# Patient Record
Sex: Female | Born: 1985 | Race: White | Hispanic: No | Smoking: Former smoker
Health system: Southern US, Community
[De-identification: ages and names within clinical notes are randomized; demographics above are authoritative.]

## PROBLEM LIST (undated history)

## (undated) DIAGNOSIS — D649 Anemia, unspecified: Secondary | ICD-10-CM

## (undated) DIAGNOSIS — G43909 Migraine, unspecified, not intractable, without status migrainosus: Secondary | ICD-10-CM

## (undated) DIAGNOSIS — J189 Pneumonia, unspecified organism: Secondary | ICD-10-CM

## (undated) DIAGNOSIS — Z8489 Family history of other specified conditions: Secondary | ICD-10-CM

## (undated) HISTORY — PX: DENTAL SURGERY: SHX609

## (undated) HISTORY — PX: TUBAL LIGATION: SHX77

## (undated) HISTORY — DX: Migraine, unspecified, not intractable, without status migrainosus: G43.909

## (undated) HISTORY — PX: OTHER SURGICAL HISTORY: SHX169

---

## 2004-12-25 ENCOUNTER — Ambulatory Visit: Payer: Self-pay | Admitting: Unknown Physician Specialty

## 2005-05-15 ENCOUNTER — Observation Stay: Payer: Self-pay | Admitting: Obstetrics and Gynecology

## 2005-06-06 ENCOUNTER — Emergency Department: Payer: Self-pay | Admitting: Emergency Medicine

## 2005-07-13 ENCOUNTER — Observation Stay: Payer: Self-pay

## 2005-07-14 ENCOUNTER — Observation Stay: Payer: Self-pay

## 2005-07-15 ENCOUNTER — Observation Stay: Payer: Self-pay

## 2005-07-16 ENCOUNTER — Ambulatory Visit: Payer: Self-pay | Admitting: Obstetrics and Gynecology

## 2005-07-26 ENCOUNTER — Observation Stay: Payer: Self-pay

## 2005-08-01 ENCOUNTER — Observation Stay: Payer: Self-pay | Admitting: Obstetrics and Gynecology

## 2005-08-02 ENCOUNTER — Observation Stay: Payer: Self-pay

## 2005-08-09 ENCOUNTER — Observation Stay: Payer: Self-pay | Admitting: Obstetrics and Gynecology

## 2005-08-13 ENCOUNTER — Observation Stay: Payer: Self-pay

## 2005-08-15 ENCOUNTER — Observation Stay: Payer: Self-pay

## 2005-08-26 ENCOUNTER — Inpatient Hospital Stay: Payer: Self-pay

## 2007-06-27 ENCOUNTER — Observation Stay: Payer: Self-pay | Admitting: Certified Nurse Midwife

## 2007-06-30 ENCOUNTER — Observation Stay: Payer: Self-pay | Admitting: Obstetrics and Gynecology

## 2007-07-13 ENCOUNTER — Inpatient Hospital Stay: Payer: Self-pay | Admitting: Obstetrics and Gynecology

## 2008-01-14 ENCOUNTER — Ambulatory Visit: Payer: Self-pay | Admitting: Internal Medicine

## 2011-01-30 ENCOUNTER — Observation Stay: Payer: Self-pay

## 2011-03-03 ENCOUNTER — Emergency Department: Payer: Self-pay | Admitting: Emergency Medicine

## 2011-03-10 ENCOUNTER — Observation Stay: Payer: Self-pay | Admitting: Obstetrics & Gynecology

## 2011-04-10 ENCOUNTER — Observation Stay: Payer: Self-pay

## 2011-05-01 ENCOUNTER — Inpatient Hospital Stay: Payer: Self-pay

## 2011-05-09 ENCOUNTER — Observation Stay: Payer: Self-pay | Admitting: Obstetrics and Gynecology

## 2011-06-03 ENCOUNTER — Observation Stay: Payer: Self-pay

## 2011-06-04 ENCOUNTER — Observation Stay: Payer: Self-pay

## 2011-06-16 ENCOUNTER — Inpatient Hospital Stay: Payer: Self-pay | Admitting: Obstetrics and Gynecology

## 2011-12-31 HISTORY — PX: CHOLECYSTECTOMY: SHX55

## 2012-01-12 ENCOUNTER — Emergency Department (HOSPITAL_COMMUNITY)
Admission: EM | Admit: 2012-01-12 | Discharge: 2012-01-12 | Disposition: A | Discharge status: Home / Self Care | Payer: BC Managed Care – PPO | Attending: Emergency Medicine | Admitting: Emergency Medicine

## 2012-01-12 ENCOUNTER — Encounter (HOSPITAL_COMMUNITY): Payer: Self-pay | Admitting: *Deleted

## 2012-01-12 DIAGNOSIS — R209 Unspecified disturbances of skin sensation: Secondary | ICD-10-CM | POA: Insufficient documentation

## 2012-01-12 DIAGNOSIS — R112 Nausea with vomiting, unspecified: Secondary | ICD-10-CM | POA: Insufficient documentation

## 2012-01-12 DIAGNOSIS — R51 Headache: Secondary | ICD-10-CM | POA: Insufficient documentation

## 2012-01-12 MED ORDER — KETOROLAC TROMETHAMINE 30 MG/ML IJ SOLN: 30.0000 mg | Freq: Once | INTRAMUSCULAR | Status: DC

## 2012-01-12 MED ORDER — METOCLOPRAMIDE HCL 5 MG/ML IJ SOLN: 10.0000 mg | Freq: Once | INTRAMUSCULAR | Status: DC

## 2012-01-12 MED ORDER — DEXAMETHASONE SODIUM PHOSPHATE 10 MG/ML IJ SOLN: 10.0000 mg | Freq: Once | INTRAMUSCULAR | Status: DC

## 2012-01-12 MED ORDER — LORAZEPAM 1 MG PO TABS
1.0000 mg | ORAL_TABLET | Freq: Once | ORAL | Status: AC
  Administered 2012-01-12: 1 mg via ORAL
  Filled 2012-01-12: qty 1

## 2012-01-12 MED ORDER — BUTORPHANOL TARTRATE 1 MG/ML IJ SOLN
1.0000 mg | Freq: Once | INTRAMUSCULAR | Status: AC
  Administered 2012-01-12: 1 mg via INTRAMUSCULAR
  Filled 2012-01-12: qty 1

## 2012-01-12 MED ORDER — PROMETHAZINE HCL 25 MG/ML IJ SOLN
12.5000 mg | Freq: Once | INTRAMUSCULAR | Status: AC
  Administered 2012-01-12: 12.5 mg via INTRAVENOUS
  Filled 2012-01-12: qty 1

## 2012-01-12 MED ORDER — DIPHENHYDRAMINE HCL 50 MG/ML IJ SOLN: 25.0000 mg | Freq: Once | INTRAMUSCULAR | Status: DC

## 2012-01-12 NOTE — ED Notes (Signed)
Pt refused IV placement, due to meds that MD ordered, pt states that she needs a certain dose and will not take what the MD ordered. MD made aware and orders changed to PO and IM

## 2012-01-12 NOTE — ED Notes (Signed)
Patient is experiencing migraine last night and felt fine this morning but then it got worse.  States that her face feels numb and she can't seem to relax and she does not feel like herself, almost like she is coming out of her skin.  Patient is crying at triage

## 2012-01-12 NOTE — ED Provider Notes (Signed)
History     CSN: [id number]  Arrival date & time 01/12/12  8657   First MD Initiated Contact with Patient 01/12/12 2008      Chief Complaint  Patient presents with  . Migraine     HPI  History provided by the patient and mother. Patient's 26 year old female with history of complex migraines formerly followed by Dr. Sharene Skeans and [facility] with a [facility] who presents with typical migraine headache unrelieved home with medications. Patient reports having headache on the left skin top of her head similar to prior headaches. She also reports having numbness and tingling to the right side of her face and right upper extremity. She reports this is typical of her headaches in the past. Patient was recently taken off her medications for headache during a pregnancy. She has just returned to using Topamax over the past 3-4 days to help prevent her headaches. She also was given the medication that she cannot recall the name for breakthrough pain of headaches. She has been taking both of these as instructed without improvement at home today. She states that on Friday night when she began having a headache she was able to fall asleep and had improvement of headache when she woke the next morning. She denies any other symptoms. She denies any fever, chills, sweats, weakness, difficulty speaking, confusion.   Past Medical History  Diagnosis Date  . Migraine     History reviewed. No pertinent past surgical history.  History reviewed. No pertinent family history.  History  Substance Use Topics  . Smoking status: Never Smoker   . Smokeless tobacco: Not on file  . Alcohol Use: Yes    OB History    Grav Para Term Preterm Abortions TAB SAB Ect Mult Living                  Review of Systems  Constitutional: Negative for fever and chills.  Respiratory: Negative for cough and shortness of breath.   Cardiovascular: Negative for chest pain.  Gastrointestinal: Positive for  nausea and vomiting. Negative for abdominal pain, diarrhea and constipation.  Neurological: Positive for headaches. Negative for dizziness and light-headedness.  All other systems reviewed and are negative.    Allergies  Latex  Home Medications   Current Outpatient Rx  Name Route Sig Dispense Refill  . TOPIRAMATE 50 MG PO TABS Oral Take 50 mg by mouth at bedtime.      BP 141/78  Pulse 117  Temp(Src) 97.6 F (36.4 C) (Oral)  Resp 20  SpO2 98%  Physical Exam  Nursing note and vitals reviewed. Constitutional: She is oriented to person, place, and time. She appears well-developed and well-nourished. No distress.  HENT:  Head: Normocephalic and atraumatic.  Mouth/Throat: Oropharynx is clear and moist.  Eyes: Conjunctivae and EOM are normal. Pupils are equal, round, and reactive to light.       No nystagmus   Neck: Normal range of motion. Neck supple. No thyromegaly present.  Cardiovascular: Normal rate and regular rhythm.   Pulmonary/Chest: Effort normal and breath sounds normal. No respiratory distress. She has no wheezes. She has no rales.  Abdominal: Soft. There is no tenderness. There is no rebound.  Musculoskeletal: She exhibits no edema and no tenderness.  Neurological: She is alert and oriented to person, place, and time. She has normal strength. No cranial nerve deficit or sensory deficit. Coordination and gait normal.  Skin: Skin is warm and dry. No rash noted.  Psychiatric: She has  a normal mood and affect. Her behavior is normal.    ED Course  Procedures    1. Headache       MDM  8:30 PM patient seen and evaluated. Patient in no acute distress. Patient with typical migraine symptoms. No red flags.  8:45 PM patient has refused standard migraine cocktail of Reglan, Decadron, Benadryl and Toradol. She has also refused an IV. Patient states the only things at work are Demerol, Stadol, and Phenergan. Have explained to patient that we do not typically give  Demerol here in the emergency room. Will plan to give Stadol and Phenergan.   Medical screening examination/treatment/procedure(s) were performed by non-physician practitioner and as supervising physician I was immediately available for consultation/collaboration. Jerold Coombe, M.D.      Angus Seller, PA 01/13/12 0449  Carleene Cooper III, MD 01/14/12 [phone]

## 2012-03-01 ENCOUNTER — Emergency Department: Payer: Self-pay | Admitting: Emergency Medicine

## 2012-04-23 ENCOUNTER — Emergency Department: Payer: Self-pay | Admitting: Emergency Medicine

## 2012-05-08 ENCOUNTER — Ambulatory Visit: Payer: Self-pay | Admitting: Internal Medicine

## 2012-09-10 ENCOUNTER — Ambulatory Visit: Payer: Self-pay | Admitting: Internal Medicine

## 2012-09-14 ENCOUNTER — Inpatient Hospital Stay: Payer: Self-pay | Admitting: Surgery

## 2012-09-14 ENCOUNTER — Ambulatory Visit: Payer: Self-pay | Admitting: Internal Medicine

## 2012-09-14 LAB — COMPREHENSIVE METABOLIC PANEL
Alkaline Phosphatase: 71 U/L (ref 50–136)
Anion Gap: 10 (ref 7–16)
BUN: 9 mg/dL (ref 7–18)
Bilirubin,Total: 0.2 mg/dL (ref 0.2–1.0)
Chloride: 109 mmol/L — ABNORMAL HIGH (ref 98–107)
Creatinine: 0.71 mg/dL (ref 0.60–1.30)
EGFR (African American): >60
Potassium: 3.1 mmol/L — ABNORMAL LOW (ref 3.5–5.1)
Total Protein: 7.5 g/dL (ref 6.4–8.2)

## 2012-09-14 LAB — CBC
HCT: 36.1 % (ref 35.0–47.0)
HGB: 12.2 g/dL (ref 12.0–16.0)
MCH: 30.2 pg (ref 26.0–34.0)
MCV: 89 fL (ref 80–100)
Platelet: 416 10*3/uL (ref 150–440)
RBC: 4.04 10*6/uL (ref 3.80–5.20)
WBC: 10.3 10*3/uL (ref 3.6–11.0)

## 2012-09-14 LAB — URINALYSIS, COMPLETE
Bilirubin,UR: NEGATIVE
Blood: NEGATIVE
Glucose,UR: NEGATIVE mg/dL (ref 0–75)
Ketone: NEGATIVE
Ph: 6 (ref 4.5–8.0)
RBC,UR: 3 /HPF (ref 0–5)
Specific Gravity: 1.024 (ref 1.003–1.030)
WBC UR: NONE SEEN /HPF (ref 0–5)

## 2012-09-15 LAB — COMPREHENSIVE METABOLIC PANEL
Albumin: 2.9 g/dL — ABNORMAL LOW (ref 3.4–5.0)
Anion Gap: 10 (ref 7–16)
BUN: 4 mg/dL — ABNORMAL LOW (ref 7–18)
Bilirubin,Total: 0.2 mg/dL (ref 0.2–1.0)
Calcium, Total: 7.7 mg/dL — ABNORMAL LOW (ref 8.5–10.1)
Chloride: 111 mmol/L — ABNORMAL HIGH (ref 98–107)
EGFR (African American): >60
EGFR (Non-African Amer.): >60
Glucose: 89 mg/dL (ref 65–99)
Potassium: 3.3 mmol/L — ABNORMAL LOW (ref 3.5–5.1)
SGOT(AST): 11 U/L — ABNORMAL LOW (ref 15–37)
Sodium: 143 mmol/L (ref 136–145)

## 2012-09-15 LAB — CBC WITH DIFFERENTIAL/PLATELET
Basophil %: 0.7 %
Eosinophil #: 0.5 10*3/uL (ref 0.0–0.7)
HCT: 32.4 % — ABNORMAL LOW (ref 35.0–47.0)
HGB: 10.8 g/dL — ABNORMAL LOW (ref 12.0–16.0)
MCH: 30.3 pg (ref 26.0–34.0)
MCHC: 33.4 g/dL (ref 32.0–36.0)
MCV: 91 fL (ref 80–100)
Monocyte #: 0.6 x10 3/mm (ref 0.2–0.9)
Monocyte %: 7.2 %
Neutrophil %: 41.4 %
Platelet: 329 10*3/uL (ref 150–440)
RBC: 3.57 10*6/uL — ABNORMAL LOW (ref 3.80–5.20)

## 2013-01-19 ENCOUNTER — Emergency Department: Payer: Self-pay | Admitting: Emergency Medicine

## 2013-01-19 LAB — POTASSIUM: Potassium: 3.2 mmol/L — ABNORMAL LOW (ref 3.5–5.1)

## 2013-01-19 LAB — CBC WITH DIFFERENTIAL/PLATELET
Basophil %: 0.2 %
Eosinophil %: 0.1 %
HGB: 12.1 g/dL (ref 12.0–16.0)
Lymphocyte %: 2.5 %
MCV: 91 fL (ref 80–100)
Monocyte #: 0.4 x10 3/mm (ref 0.2–0.9)
Monocyte %: 3.3 %
RBC: 3.99 10*6/uL (ref 3.80–5.20)
WBC: 12.6 10*3/uL — ABNORMAL HIGH (ref 3.6–11.0)

## 2013-01-19 LAB — COMPREHENSIVE METABOLIC PANEL
Albumin: 3.9 g/dL (ref 3.4–5.0)
Alkaline Phosphatase: 121 U/L (ref 50–136)
Anion Gap: 11 (ref 7–16)
BUN: 13 mg/dL (ref 7–18)
Bilirubin,Total: 0.4 mg/dL (ref 0.2–1.0)
Co2: 16 mmol/L — ABNORMAL LOW (ref 21–32)
EGFR (African American): >60
Glucose: 106 mg/dL — ABNORMAL HIGH (ref 65–99)
Potassium: 3.7 mmol/L (ref 3.5–5.1)
SGOT(AST): 27 U/L (ref 15–37)
Sodium: 141 mmol/L (ref 136–145)

## 2013-01-20 ENCOUNTER — Inpatient Hospital Stay: Payer: Self-pay | Admitting: Internal Medicine

## 2013-01-20 LAB — COMPREHENSIVE METABOLIC PANEL
Anion Gap: 8 (ref 7–16)
BUN: 6 mg/dL — ABNORMAL LOW (ref 7–18)
Bilirubin,Total: 0.3 mg/dL (ref 0.2–1.0)
Chloride: 113 mmol/L — ABNORMAL HIGH (ref 98–107)
Co2: 22 mmol/L (ref 21–32)
Creatinine: 0.66 mg/dL (ref 0.60–1.30)
EGFR (Non-African Amer.): >60
Osmolality: 282 (ref 275–301)
Potassium: 3.4 mmol/L — ABNORMAL LOW (ref 3.5–5.1)
SGOT(AST): 55 U/L — ABNORMAL HIGH (ref 15–37)

## 2013-01-20 LAB — URINALYSIS, COMPLETE
Bacteria: NONE SEEN
Glucose,UR: NEGATIVE mg/dL (ref 0–75)
Leukocyte Esterase: NEGATIVE
Nitrite: NEGATIVE
Protein: NEGATIVE
RBC,UR: 4 /HPF (ref 0–5)

## 2013-01-20 LAB — LIPASE, BLOOD: Lipase: 124 U/L (ref 73–393)

## 2013-01-20 LAB — CBC
HCT: 35.5 % (ref 35.0–47.0)
MCH: 30.3 pg (ref 26.0–34.0)
MCHC: 33.5 g/dL (ref 32.0–36.0)
MCV: 91 fL (ref 80–100)
Platelet: 268 10*3/uL (ref 150–440)
RBC: 3.92 10*6/uL (ref 3.80–5.20)
WBC: 5.1 10*3/uL (ref 3.6–11.0)

## 2013-01-20 LAB — HCG, QUANTITATIVE, PREGNANCY: Beta Hcg, Quant.: <1 m[IU]/mL — ABNORMAL LOW

## 2013-01-20 LAB — MAGNESIUM: Magnesium: 1.9 mg/dL

## 2013-01-21 LAB — COMPREHENSIVE METABOLIC PANEL
Albumin: 2.8 g/dL — ABNORMAL LOW (ref 3.4–5.0)
Anion Gap: 10 (ref 7–16)
BUN: 3 mg/dL — ABNORMAL LOW (ref 7–18)
Calcium, Total: 7 mg/dL — CL (ref 8.5–10.1)
Chloride: 113 mmol/L — ABNORMAL HIGH (ref 98–107)
Osmolality: 281 (ref 275–301)
Potassium: 3.2 mmol/L — ABNORMAL LOW (ref 3.5–5.1)
SGOT(AST): 202 U/L — ABNORMAL HIGH (ref 15–37)
SGPT (ALT): 244 U/L — ABNORMAL HIGH (ref 12–78)
Sodium: 143 mmol/L (ref 136–145)
Total Protein: 5.5 g/dL — ABNORMAL LOW (ref 6.4–8.2)

## 2013-01-21 LAB — CBC WITH DIFFERENTIAL/PLATELET
Basophil #: 0 10*3/uL (ref 0.0–0.1)
Basophil %: 0.6 %
Eosinophil #: 0.3 10*3/uL (ref 0.0–0.7)
Eosinophil %: 4.9 %
HCT: 30.4 % — ABNORMAL LOW (ref 35.0–47.0)
Lymphocyte #: 2.4 10*3/uL (ref 1.0–3.6)
Lymphocyte %: 40.9 %
MCH: 31.5 pg (ref 26.0–34.0)
Monocyte #: 0.6 x10 3/mm (ref 0.2–0.9)
Neutrophil %: 42.5 %

## 2013-01-21 LAB — PREGNANCY, URINE: Pregnancy Test, Urine: NEGATIVE m[IU]/mL

## 2013-01-22 LAB — MAGNESIUM: Magnesium: 2.1 mg/dL

## 2013-01-22 LAB — BASIC METABOLIC PANEL
Anion Gap: 8 (ref 7–16)
Calcium, Total: 7.4 mg/dL — ABNORMAL LOW (ref 8.5–10.1)
Creatinine: 0.66 mg/dL (ref 0.60–1.30)
EGFR (Non-African Amer.): >60
Glucose: 91 mg/dL (ref 65–99)
Potassium: 3.7 mmol/L (ref 3.5–5.1)
Sodium: 143 mmol/L (ref 136–145)

## 2013-02-02 ENCOUNTER — Emergency Department: Payer: Self-pay | Admitting: Emergency Medicine

## 2013-03-27 ENCOUNTER — Emergency Department: Payer: Self-pay | Admitting: Emergency Medicine

## 2013-04-04 ENCOUNTER — Emergency Department: Payer: Self-pay | Admitting: Emergency Medicine

## 2013-04-04 LAB — BASIC METABOLIC PANEL
Anion Gap: 6 — ABNORMAL LOW (ref 7–16)
BUN: 13 mg/dL (ref 7–18)
Calcium, Total: 7.7 mg/dL — ABNORMAL LOW (ref 8.5–10.1)
Chloride: 112 mmol/L — ABNORMAL HIGH (ref 98–107)
Creatinine: 0.68 mg/dL (ref 0.60–1.30)
EGFR (Non-African Amer.): >60
Osmolality: 282 (ref 275–301)

## 2013-04-04 LAB — TROPONIN I: Troponin-I: <0.02 ng/mL

## 2013-04-04 LAB — CBC
HCT: 42.4 % (ref 35.0–47.0)
MCHC: 32.3 g/dL (ref 32.0–36.0)
RBC: 4.59 10*6/uL (ref 3.80–5.20)
RDW: 13.6 % (ref 11.5–14.5)
WBC: 18 10*3/uL — ABNORMAL HIGH (ref 3.6–11.0)

## 2013-04-04 LAB — CK TOTAL AND CKMB (NOT AT ARMC): CK-MB: <0.5 ng/mL — ABNORMAL LOW (ref 0.5–3.6)

## 2013-04-21 ENCOUNTER — Ambulatory Visit (INDEPENDENT_AMBULATORY_CARE_PROVIDER_SITE_OTHER): Payer: BC Managed Care – PPO | Admitting: General Surgery

## 2013-04-21 ENCOUNTER — Other Ambulatory Visit: Payer: Self-pay

## 2013-04-21 ENCOUNTER — Encounter: Payer: Self-pay | Admitting: General Surgery

## 2013-04-21 VITALS — BP 102/72 | HR 86 | Resp 12 | Ht 64.0 in | Wt 138.0 lb

## 2013-04-21 DIAGNOSIS — R221 Localized swelling, mass and lump, neck: Secondary | ICD-10-CM

## 2013-04-21 DIAGNOSIS — L723 Sebaceous cyst: Secondary | ICD-10-CM

## 2013-04-21 DIAGNOSIS — L729 Follicular cyst of the skin and subcutaneous tissue, unspecified: Secondary | ICD-10-CM

## 2013-04-21 DIAGNOSIS — R220 Localized swelling, mass and lump, head: Secondary | ICD-10-CM

## 2013-04-21 NOTE — Patient Instructions (Addendum)
Discussed removal of the neck nodule in the office

## 2013-04-21 NOTE — Progress Notes (Signed)
Patient ID: [name], female   DOB: [birth year], 27 y.o.   MRN: [id number]  Chief Complaint  Patient presents with  . Other    neck cyst    HPI [name] is a 27 y.o. female here today for an evaluation of an posterior neck cyst at hair line that has been there for about 2 years but seems to be getting larger over past 2 months.  Seems to be causing more neck pain.  Went to dermatology but no biopsy done. She does have a history of migraines and gets Botox injections and trigger point injections. The patient reports that she has discussed this lesion with her neurologist who completes the Botox injections as well as trigger point injections was advised that not have been completed if this area of her concern.  HPI  Past Medical History  Diagnosis Date  . Migraine     hemiplegic  . Migraine     Past Surgical History  Procedure Laterality Date  . Cholecystectomy  2013  . Dental surgery      No family history on file.  Social History History  Substance Use Topics  . Smoking status: Former Games developer  . Smokeless tobacco: Not on file  . Alcohol Use: Yes     Comment: 4/week    Allergies  Allergen Reactions  . Latex Rash    Current Outpatient Prescriptions  Medication Sig Dispense Refill  . gabapentin (NEURONTIN) 300 MG capsule Take 300 mg by mouth 3 (three) times daily.       Marland Kitchen ketorolac (TORADOL) 30 MG/ML injection Inject 60 mg into the muscle every 6 (six) hours as needed for pain.      Marland Kitchen OLANZapine (ZYPREXA) 2.5 MG tablet Take 2.5 mg by mouth as needed.      . pantoprazole (PROTONIX) 40 MG tablet Take 40 mg by mouth daily.      . promethazine (PHENERGAN) 50 MG/ML injection Inject into the muscle.      . topiramate (TOPAMAX) 50 MG tablet Take 50 mg by mouth at bedtime.       No current facility-administered medications for this visit.    Review of Systems Review of Systems  Constitutional: Negative.   Respiratory: Negative.   Cardiovascular: Negative.     Blood  pressure 102/72, pulse 86, resp. rate 12, height 5\' 4"  (1.626 m), weight 138 lb (62.596 kg), last menstrual period 03/24/2013.  Physical Exam Physical Exam  Constitutional: She is oriented to person, place, and time. She appears well-developed and well-nourished.  Cardiovascular: Normal rate and regular rhythm.   Pulmonary/Chest: Effort normal and breath sounds normal.  Neurological: She is alert and oriented to person, place, and time.  Skin: Skin is warm and dry.  Right posterior neck 6 mm nodule   No anterior or posterior cervical adenopathy is appreciated.  Data Reviewed Notes from the patient's dermatologist dated April 01, 2013 were reviewed.  Ultrasound examination of the area of concern on the right posterior neck identified a anechoic/hypoechoic nodule with posterior acoustic enhancement measuring 0.39 x 0.63 x 0.68 cm. This was approximately 0.45 cm below the level of the skin, above the level of the muscle. No vascular flow was identified on duplex imaging.  Assessment    Deep dermal cyst, right posterior neck.     Plan      Because of the patient's report of increasing size and discomfort excision was offered as an office procedure under local anesthesia. This be scheduled a convenient time. Because  of its location on the back, she has been encouraged to have a driver the day of the procedure.       [name] 04/21/2013, 9:32 PM  Cc: Diona Browner, M.D.

## 2013-05-18 ENCOUNTER — Telehealth: Payer: Self-pay | Admitting: *Deleted

## 2013-05-18 NOTE — Telephone Encounter (Signed)
Will RX w/ Valium 10 mg po prior to the procedure. The patient will come in to pick up the RX and sign consent. She is aware of the need for a driver.

## 2013-05-18 NOTE — Telephone Encounter (Signed)
Phone call from mother states Armetta wanted her to call and ask if she could have a RX form something to help calm her down the day of the procedure-excision right neck mass on 05-27-13.  She is real nervous and passes out easily. She is aware that a consent would need to be signed ahead of time. Uses [facility] and is allergic to LATEX.  Pt  [id number].  Thanks

## 2013-05-20 ENCOUNTER — Inpatient Hospital Stay: Payer: Self-pay | Admitting: Family Medicine

## 2013-05-20 LAB — CBC
MCH: 30.2 pg (ref 26.0–34.0)
Platelet: 249 10*3/uL (ref 150–440)

## 2013-05-20 LAB — COMPREHENSIVE METABOLIC PANEL
Albumin: 3.7 g/dL (ref 3.4–5.0)
Alkaline Phosphatase: 59 U/L (ref 50–136)
BUN: 8 mg/dL (ref 7–18)
Calcium, Total: 8.1 mg/dL — ABNORMAL LOW (ref 8.5–10.1)
Co2: 20 mmol/L — ABNORMAL LOW (ref 21–32)
Creatinine: 0.59 mg/dL — ABNORMAL LOW (ref 0.60–1.30)
EGFR (African American): >60
Osmolality: 280 (ref 275–301)
SGOT(AST): 29 U/L (ref 15–37)
Sodium: 141 mmol/L (ref 136–145)

## 2013-05-20 LAB — URINALYSIS, COMPLETE
Bilirubin,UR: NEGATIVE
Ketone: NEGATIVE
Leukocyte Esterase: NEGATIVE
Nitrite: NEGATIVE
Ph: 5 (ref 4.5–8.0)
Protein: NEGATIVE

## 2013-05-20 LAB — CK: CK, Total: 53 U/L (ref 21–215)

## 2013-05-21 LAB — CBC WITH DIFFERENTIAL/PLATELET
Basophil #: 0 10*3/uL (ref 0.0–0.1)
Basophil %: 0.6 %
Eosinophil #: 0 10*3/uL (ref 0.0–0.7)
HGB: 10.1 g/dL — ABNORMAL LOW (ref 12.0–16.0)
Lymphocyte #: 0.9 10*3/uL — ABNORMAL LOW (ref 1.0–3.6)
Lymphocyte %: 31.4 %
MCHC: 33.9 g/dL (ref 32.0–36.0)
Monocyte #: 0.3 x10 3/mm (ref 0.2–0.9)
Monocyte %: 10.9 %
Platelet: 205 10*3/uL (ref 150–440)

## 2013-05-21 LAB — BASIC METABOLIC PANEL
Anion Gap: 6 — ABNORMAL LOW (ref 7–16)
Calcium, Total: 6.9 mg/dL — CL (ref 8.5–10.1)
Chloride: 112 mmol/L — ABNORMAL HIGH (ref 98–107)
Co2: 22 mmol/L (ref 21–32)
Creatinine: 0.66 mg/dL (ref 0.60–1.30)
EGFR (Non-African Amer.): >60
Potassium: 3 mmol/L — ABNORMAL LOW (ref 3.5–5.1)
Sodium: 140 mmol/L (ref 136–145)

## 2013-05-21 LAB — SEDIMENTATION RATE: Erythrocyte Sed Rate: 13 mm/hr (ref 0–20)

## 2013-05-22 LAB — CBC WITH DIFFERENTIAL/PLATELET
Basophil #: 0 10*3/uL (ref 0.0–0.1)
Basophil #: 0 10*3/uL (ref 0.0–0.1)
Basophil %: 0.3 %
Eosinophil #: 0.1 10*3/uL (ref 0.0–0.7)
Eosinophil %: 1.3 %
HCT: 32.6 % — ABNORMAL LOW (ref 35.0–47.0)
Lymphocyte #: 0.4 10*3/uL — ABNORMAL LOW (ref 1.0–3.6)
MCHC: 33.6 g/dL (ref 32.0–36.0)
MCHC: 34.1 g/dL (ref 32.0–36.0)
MCV: 89 fL (ref 80–100)
MCV: 88 fL (ref 80–100)
Monocyte #: 0.3 x10 3/mm (ref 0.2–0.9)
Monocyte #: 0.3 x10 3/mm (ref 0.2–0.9)
Monocyte %: 4.2 %
Platelet: 229 10*3/uL (ref 150–440)
Platelet: 235 10*3/uL (ref 150–440)
RBC: 3.63 10*6/uL — ABNORMAL LOW (ref 3.80–5.20)
RBC: 3.68 10*6/uL — ABNORMAL LOW (ref 3.80–5.20)
WBC: 8.6 10*3/uL (ref 3.6–11.0)

## 2013-05-22 LAB — BASIC METABOLIC PANEL
Anion Gap: 6 — ABNORMAL LOW (ref 7–16)
Calcium, Total: 7.9 mg/dL — ABNORMAL LOW (ref 8.5–10.1)
Chloride: 108 mmol/L — ABNORMAL HIGH (ref 98–107)
Creatinine: 0.69 mg/dL (ref 0.60–1.30)
EGFR (Non-African Amer.): >60
Glucose: 94 mg/dL (ref 65–99)
Osmolality: 273 (ref 275–301)
Potassium: 3.4 mmol/L — ABNORMAL LOW (ref 3.5–5.1)
Sodium: 139 mmol/L (ref 136–145)

## 2013-05-22 LAB — DRUG SCREEN, URINE
Barbiturates, Ur Screen: POSITIVE (ref ?–200)
Benzodiazepine, Ur Scrn: NEGATIVE (ref ?–200)
Cannabinoid 50 Ng, Ur ~~LOC~~: NEGATIVE (ref ?–50)
Cocaine Metabolite,Ur ~~LOC~~: NEGATIVE (ref ?–300)

## 2013-05-22 LAB — DIFFERENTIAL
Bands: 21 %
Basophil #: 0 10*3/uL (ref 0.0–0.1)
Basophil %: 0.3 %
Eosinophil #: 0.1 10*3/uL (ref 0.0–0.7)
Eosinophil: 1 %
Lymphocyte %: 7.7 %
Lymphocytes: 7 %
Monocytes: 3 %

## 2013-05-22 LAB — URINALYSIS, COMPLETE
Bilirubin,UR: NEGATIVE
Blood: NEGATIVE
Glucose,UR: NEGATIVE mg/dL (ref 0–75)
Ketone: NEGATIVE
Ph: 6 (ref 4.5–8.0)
Protein: NEGATIVE
RBC,UR: <1 /HPF (ref 0–5)
Squamous Epithelial: 5
WBC UR: 2 /HPF (ref 0–5)

## 2013-05-23 ENCOUNTER — Ambulatory Visit: Payer: Self-pay | Admitting: Neurology

## 2013-05-23 LAB — CBC WITH DIFFERENTIAL/PLATELET
Eosinophil #: 0.1 10*3/uL (ref 0.0–0.7)
Eosinophil %: 1.4 %
MCV: 89 fL (ref 80–100)
Monocyte #: 0.5 x10 3/mm (ref 0.2–0.9)
Monocyte %: 8.9 %
Neutrophil %: 65 %
Platelet: 265 10*3/uL (ref 150–440)
RBC: 3.95 10*6/uL (ref 3.80–5.20)
RDW: 13.5 % (ref 11.5–14.5)
WBC: 5.4 10*3/uL (ref 3.6–11.0)

## 2013-05-23 LAB — BASIC METABOLIC PANEL
Anion Gap: 6 — ABNORMAL LOW (ref 7–16)
Calcium, Total: 7.4 mg/dL — ABNORMAL LOW (ref 8.5–10.1)
Chloride: 107 mmol/L (ref 98–107)
Co2: 27 mmol/L (ref 21–32)
Creatinine: 0.63 mg/dL (ref 0.60–1.30)
Glucose: 92 mg/dL (ref 65–99)
Potassium: 2.7 mmol/L — ABNORMAL LOW (ref 3.5–5.1)
Sodium: 140 mmol/L (ref 136–145)

## 2013-05-24 DIAGNOSIS — I38 Endocarditis, valve unspecified: Secondary | ICD-10-CM

## 2013-05-24 LAB — BASIC METABOLIC PANEL
Anion Gap: 6 — ABNORMAL LOW (ref 7–16)
Calcium, Total: 8 mg/dL — ABNORMAL LOW (ref 8.5–10.1)
Chloride: 111 mmol/L — ABNORMAL HIGH (ref 98–107)
Co2: 24 mmol/L (ref 21–32)
EGFR (African American): >60
Potassium: 3.6 mmol/L (ref 3.5–5.1)
Sodium: 141 mmol/L (ref 136–145)

## 2013-05-24 LAB — MAGNESIUM: Magnesium: 1.6 mg/dL — ABNORMAL LOW

## 2013-05-24 LAB — VANCOMYCIN, TROUGH: Vancomycin, Trough: 10 ug/mL (ref 10–20)

## 2013-05-25 LAB — BASIC METABOLIC PANEL
Creatinine: 0.69 mg/dL (ref 0.60–1.30)
EGFR (African American): >60

## 2013-05-25 LAB — STOOL CULTURE

## 2013-05-25 LAB — MAGNESIUM: Magnesium: 1.6 mg/dL — ABNORMAL LOW

## 2013-05-27 ENCOUNTER — Ambulatory Visit: Payer: BC Managed Care – PPO | Admitting: General Surgery

## 2013-05-28 LAB — CULTURE, BLOOD (SINGLE)

## 2013-06-28 ENCOUNTER — Ambulatory Visit (INDEPENDENT_AMBULATORY_CARE_PROVIDER_SITE_OTHER): Payer: BC Managed Care – PPO | Admitting: General Surgery

## 2013-06-28 ENCOUNTER — Encounter: Payer: Self-pay | Admitting: General Surgery

## 2013-06-28 VITALS — BP 106/70 | Ht 64.0 in | Wt 136.0 lb

## 2013-06-28 DIAGNOSIS — R220 Localized swelling, mass and lump, head: Secondary | ICD-10-CM

## 2013-06-28 DIAGNOSIS — R221 Localized swelling, mass and lump, neck: Secondary | ICD-10-CM

## 2013-06-28 NOTE — Progress Notes (Signed)
Patient ID: [name], female   DOB: 26-Nov-1986, 27 y.o.   MRN: [id number]  Chief Complaint  Patient presents with  . Procedure    excision neck mass    HPI [name] is a 27 y.o. female here today for an excision right neck mass. The patient had previously been evaluated for enlarging nodule the posterior aspect of the right neck. HPI  Past Medical History  Diagnosis Date  . Migraine     hemiplegic  . Migraine     Past Surgical History  Procedure Laterality Date  . Cholecystectomy  2013  . Dental surgery      No family history on file.  Social History History  Substance Use Topics  . Smoking status: Former Smoker -- 1.00 packs/day for 5 years  . Smokeless tobacco: Never Used  . Alcohol Use: Yes     Comment: 4/week    Allergies  Allergen Reactions  . Latex Rash    Current Outpatient Prescriptions  Medication Sig Dispense Refill  . DULoxetine (CYMBALTA) 60 MG capsule Take 1 capsule by mouth daily.      Marland Kitchen ketorolac (TORADOL) 30 MG/ML injection Inject 60 mg into the muscle every 6 (six) hours as needed for pain.      Marland Kitchen OLANZapine (ZYPREXA) 2.5 MG tablet Take 2.5 mg by mouth as needed.      . pantoprazole (PROTONIX) 40 MG tablet Take 40 mg by mouth daily.      . promethazine (PHENERGAN) 50 MG/ML injection Inject into the muscle.      . topiramate (TOPAMAX) 50 MG tablet Take 50 mg by mouth at bedtime.       No current facility-administered medications for this visit.    Review of Systems Review of Systems  Blood pressure 106/70, height 5\' 4"  (1.626 m), weight 136 lb (61.689 kg), last menstrual period 06/28/2013.  Physical Exam Physical Exam The patient placed her finger on the area of concern. A small nodular area was appreciated prior to the injection of local anesthesia.  Data Reviewed After reviewing the procedure she was amenable to proceed. 10 cc of 0.5% Xylocaine with 0.25% Marcaine with one 200,000 units of epinephrine was utilized well-tolerated. A  small transverse incision was made. Below the subcutaneous fascia a small 5 mm nodular area consistent with a lymph node was identified. This correlated with the clinical exam. The defect was closed with a 3-0 Vicryl suture at the fascial layer followed by an interrupted 5-0 nylon simple sutures. Telfa and Tegaderm dressing applied.  Assessment    Posterior cervical lymph node.     Plan    The patient was instructed in regards to wound care. Tylenol or Advil as needed for soreness. Localized application encouraged. Nursing followup in one week.        [name] 06/28/2013, 9:01 PM

## 2013-07-02 LAB — PATHOLOGY

## 2013-07-05 ENCOUNTER — Telehealth: Payer: Self-pay | Admitting: General Surgery

## 2013-07-05 NOTE — Telephone Encounter (Signed)
x

## 2013-07-06 ENCOUNTER — Ambulatory Visit (INDEPENDENT_AMBULATORY_CARE_PROVIDER_SITE_OTHER): Payer: BC Managed Care – PPO | Admitting: *Deleted

## 2013-07-06 DIAGNOSIS — R220 Localized swelling, mass and lump, head: Secondary | ICD-10-CM

## 2013-07-06 DIAGNOSIS — R221 Localized swelling, mass and lump, neck: Secondary | ICD-10-CM

## 2013-07-06 NOTE — Progress Notes (Signed)
Notify the patient that the lymph node was benign.  Sutures removed and steri strip applied, aware it may come off in a week. No signs of infection noted.

## 2013-07-06 NOTE — Patient Instructions (Addendum)
Call for questions or concerns.

## 2013-08-03 ENCOUNTER — Emergency Department: Payer: Self-pay | Admitting: Emergency Medicine

## 2013-08-03 LAB — CBC
HCT: 35.8 % (ref 35.0–47.0)
HGB: 12.2 g/dL (ref 12.0–16.0)
MCH: 31.4 pg (ref 26.0–34.0)
MCV: 92 fL (ref 80–100)
Platelet: 382 10*3/uL (ref 150–440)
RBC: 3.89 10*6/uL (ref 3.80–5.20)
RDW: 15.3 % — ABNORMAL HIGH (ref 11.5–14.5)
WBC: 8.3 10*3/uL (ref 3.6–11.0)

## 2013-08-03 LAB — URINALYSIS, COMPLETE
Bilirubin,UR: NEGATIVE
Blood: NEGATIVE
Glucose,UR: NEGATIVE mg/dL (ref 0–75)
Ketone: NEGATIVE
Ph: 5 (ref 4.5–8.0)
RBC,UR: <1 /HPF (ref 0–5)
Squamous Epithelial: 1
Transitional Epi: <1

## 2013-08-03 LAB — COMPREHENSIVE METABOLIC PANEL
Anion Gap: 5 — ABNORMAL LOW (ref 7–16)
BUN: 11 mg/dL (ref 7–18)
Bilirubin,Total: 0.2 mg/dL (ref 0.2–1.0)
Calcium, Total: 8.5 mg/dL (ref 8.5–10.1)
Chloride: 113 mmol/L — ABNORMAL HIGH (ref 98–107)
EGFR (Non-African Amer.): >60
Glucose: 84 mg/dL (ref 65–99)
Potassium: 4.1 mmol/L (ref 3.5–5.1)
SGOT(AST): 20 U/L (ref 15–37)
Sodium: 139 mmol/L (ref 136–145)
Total Protein: 6.8 g/dL (ref 6.4–8.2)

## 2013-08-09 ENCOUNTER — Encounter (HOSPITAL_COMMUNITY): Payer: Self-pay | Admitting: Adult Health

## 2013-08-09 ENCOUNTER — Emergency Department (HOSPITAL_COMMUNITY)
Admission: EM | Admit: 2013-08-09 | Discharge: 2013-08-10 | Disposition: A | Discharge status: Home / Self Care | Payer: BC Managed Care – PPO | Attending: Emergency Medicine | Admitting: Emergency Medicine

## 2013-08-09 ENCOUNTER — Emergency Department: Payer: Self-pay | Admitting: Emergency Medicine

## 2013-08-09 DIAGNOSIS — R112 Nausea with vomiting, unspecified: Secondary | ICD-10-CM | POA: Insufficient documentation

## 2013-08-09 DIAGNOSIS — Z79899 Other long term (current) drug therapy: Secondary | ICD-10-CM | POA: Insufficient documentation

## 2013-08-09 DIAGNOSIS — H5460 Unqualified visual loss, one eye, unspecified: Secondary | ICD-10-CM | POA: Insufficient documentation

## 2013-08-09 DIAGNOSIS — G43909 Migraine, unspecified, not intractable, without status migrainosus: Secondary | ICD-10-CM

## 2013-08-09 DIAGNOSIS — R000 Tachycardia, unspecified: Secondary | ICD-10-CM | POA: Insufficient documentation

## 2013-08-09 DIAGNOSIS — Z87891 Personal history of nicotine dependence: Secondary | ICD-10-CM | POA: Insufficient documentation

## 2013-08-09 DIAGNOSIS — R209 Unspecified disturbances of skin sensation: Secondary | ICD-10-CM | POA: Insufficient documentation

## 2013-08-09 MED ORDER — DEXAMETHASONE SODIUM PHOSPHATE 10 MG/ML IJ SOLN
10.0000 mg | Freq: Once | INTRAMUSCULAR | Status: AC
  Administered 2013-08-09: 10 mg via INTRAVENOUS
  Filled 2013-08-09: qty 1

## 2013-08-09 MED ORDER — PROMETHAZINE HCL 25 MG/ML IJ SOLN
12.5000 mg | Freq: Once | INTRAMUSCULAR | Status: AC
  Administered 2013-08-09: 12.5 mg via INTRAVENOUS
  Filled 2013-08-09 (×2): qty 1

## 2013-08-09 MED ORDER — MORPHINE SULFATE 4 MG/ML IJ SOLN
8.0000 mg | Freq: Once | INTRAMUSCULAR | Status: AC
  Administered 2013-08-09: 8 mg via INTRAVENOUS
  Filled 2013-08-09: qty 2

## 2013-08-09 MED ORDER — HYDROMORPHONE HCL PF 1 MG/ML IJ SOLN
1.0000 mg | Freq: Once | INTRAMUSCULAR | Status: AC
  Administered 2013-08-09: 1 mg via INTRAVENOUS
  Filled 2013-08-09: qty 1

## 2013-08-09 MED ORDER — SODIUM CHLORIDE 0.9 % IV BOLUS (SEPSIS)
1000.0000 mL | Freq: Once | INTRAVENOUS | Status: AC
  Administered 2013-08-09: 1000 mL via INTRAVENOUS

## 2013-08-09 NOTE — ED Notes (Signed)
Presents with 2 day history of migraine associated with left sided headache, nausea, right arm numbness, right eye blindness. Symptoms are typical for a migraine for pt  per pt. Alert, oriented, no drift or droop. Pale.

## 2013-08-09 NOTE — ED Provider Notes (Signed)
CSN: [id number]     Arrival date & time 08/09/13  2151 History    This chart was scribed for a non-physician practitioner, Jaynie Crumble, PA-C, working with Claudean Kinds, MD by Frederik Pear, ED Scribe. This patient was seen in room TR11C/TR11C and the patient's care was started at 2206.   First MD Initiated Contact with Patient 08/09/13 2206     Chief Complaint  Patient presents with  . Migraine   (Consider location/radiation/quality/duration/timing/severity/associated sxs/prior Treatment) The history is provided by the patient and medical records. No language interpreter was used.    HPI Comments: [name] is a 27 y.o. 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 She reports the current symptoms are consistent with typical migraines. She denies photophobia, phonophobia, and neck pain. She is followed by Dr. Catalina Lunger in [place] and takes Topamax as a preventative medication and Toradol and IV Phenergan for emergencies. She states she typically goes the ED in [place], but avoided going tonight because of lengthy wait times. She states she has found relief previously in the ED from Decadron, Dilaudid, and IV Phenergan. She has a follow up appointment with Dr. Catalina Lunger later this week.  Past Medical History  Diagnosis Date  . Migraine     hemiplegic  . Migraine    Past Surgical History  Procedure Laterality Date  . Cholecystectomy  2013  . Dental surgery     History reviewed. No pertinent family history. History  Substance Use Topics  . Smoking status: Former Smoker -- 1.00 packs/day for 5 years  . Smokeless tobacco: Never Used  . Alcohol Use: Yes     Comment: 4/week   OB History   Grav Para Term Preterm Abortions TAB SAB Ect Mult Living   3         3     Obstetric Comments   Age first pregnancy  18     Review of Systems  Eyes: Positive for visual disturbance (right eye blindness). Negative for photophobia.  Neurological: Positive for headaches.   Allergies  Latex  Home Medications   Current Outpatient Rx  Name  Route  Sig  Dispense  Refill  . DULoxetine (CYMBALTA) 60 MG capsule   Oral   Take 1 capsule by mouth daily.         Marland Kitchen ketorolac (TORADOL) 30 MG/ML injection   Intramuscular   Inject 60 mg into the muscle every 6 (six) hours as needed for pain.         Marland Kitchen OLANZapine (ZYPREXA) 2.5 MG tablet   Oral   Take 2.5 mg by mouth as needed.         . pantoprazole (PROTONIX) 40 MG tablet   Oral   Take 40 mg by mouth daily.         . promethazine (PHENERGAN) 50 MG/ML injection   Intramuscular   Inject into the muscle.         . topiramate (TOPAMAX) 50 MG tablet   Oral   Take 50 mg by mouth at bedtime.          BP 101/70  Pulse 103  Temp(Src) 97.6 F (36.4 C) (Oral)  Resp 16  Wt 140 lb (63.504 kg)  BMI 24.02 kg/m2  SpO2 98% Physical Exam  Nursing note and vitals reviewed. Constitutional: She is oriented to person,  place, and time. She appears well-developed and well-nourished. No distress.  Appears pale.   HENT:  Head: Normocephalic and atraumatic.  Lips appear dry.  Eyes: Conjunctivae are normal. Pupils are equal, round, and reactive to light. Right eye exhibits nystagmus. Left eye exhibits nystagmus.  Bilateral horizontal nystagmus  Neck: Normal range of motion. Neck supple. No tracheal deviation present.  Cardiovascular: Regular rhythm and normal heart sounds.  Tachycardia present.  Exam reveals no gallop and no friction rub.   No murmur heard. Pulmonary/Chest: Effort normal. No respiratory distress. She has no wheezes. She has no rales. She exhibits no tenderness.  Abdominal: There is no tenderness.  Musculoskeletal: Normal range of motion. She exhibits no tenderness.  Neurological: She is alert and oriented to person, place, and time.   5/5 and equal upper and lower extremity strength bilaterally. Equal grip strength bilaterally.   Skin: Skin is warm and dry. There is pallor.  Psychiatric: She has a normal mood and affect. Her behavior is normal.    ED Course   Procedures (including critical care time)  DIAGNOSTIC STUDIES: Oxygen Saturation is 98% on room air, normal by my interpretation.    COORDINATION OF CARE:  22:25- Discussed planned course of treatment with the patient, including Decadron, Phenergan, Dilaudid, and IV fluids, who is agreeable at this time.  23:02- Recheck- The pt is still not feeling any better. Will order IV morphine.   1. Migraine headache     MDM   Medications  dexamethasone (DECADRON) injection 10 mg (10 mg Intravenous Given 08/09/13 2236)  promethazine (PHENERGAN) injection 12.5 mg (12.5 mg Intravenous Given 08/09/13 2237)  HYDROmorphone (DILAUDID) injection 1 mg (1 mg Intravenous Given 08/09/13 2237)  sodium chloride 0.9 % bolus 1,000 mL (1,000 mLs Intravenous New Bag/Given 08/09/13 2232)  morphine 4 MG/ML injection 8 mg (8 mg Intravenous Given 08/09/13 2312)   11:41 PM Pt with typical for her migraine. No relief at home with phenergan and toradol IM. Pt feeling better now. Her headache improved after above medications  And fluid bolus. She wants to go home. Mother is by bedside. She will be d/c home in stable condition. She has a fu apt with her neurologist in 2 days.   Filed Vitals:   08/09/13 2156  BP: 101/70  Pulse: 103  Temp: 97.6 F (36.4 C)  TempSrc: Oral  Resp: 16  Weight: 140 lb (63.504 kg)  SpO2: 98%   I personally performed the services described in this documentation, which was scribed in my presence. The recorded information has been reviewed and is accurate.     Lottie Mussel, PA-C 08/09/13 2344

## 2013-08-10 NOTE — ED Notes (Signed)
Pt has ride home.

## 2013-08-13 NOTE — ED Provider Notes (Signed)
Medical screening examination/treatment/procedure(s) were performed by non-physician practitioner and as supervising physician I was immediately available for consultation/collaboration.   Ozil Stettler Joseph Gentry Pilson, MD 08/13/13 1101 

## 2013-08-18 ENCOUNTER — Inpatient Hospital Stay: Payer: Self-pay | Admitting: Specialist

## 2013-08-18 LAB — ACETAMINOPHEN LEVEL: Acetaminophen: <2 ug/mL

## 2013-08-18 LAB — COMPREHENSIVE METABOLIC PANEL
Albumin: 3.3 g/dL — ABNORMAL LOW (ref 3.4–5.0)
Alkaline Phosphatase: 333 U/L — ABNORMAL HIGH (ref 50–136)
Anion Gap: 6 — ABNORMAL LOW (ref 7–16)
BUN: 6 mg/dL — ABNORMAL LOW (ref 7–18)
Bilirubin,Total: 0.5 mg/dL (ref 0.2–1.0)
Calcium, Total: 8.3 mg/dL — ABNORMAL LOW (ref 8.5–10.1)
Chloride: 109 mmol/L — ABNORMAL HIGH (ref 98–107)
Co2: 24 mmol/L (ref 21–32)
Creatinine: 0.65 mg/dL (ref 0.60–1.30)
EGFR (African American): >60
Glucose: 85 mg/dL (ref 65–99)
SGPT (ALT): 1266 U/L — ABNORMAL HIGH (ref 12–78)
Sodium: 139 mmol/L (ref 136–145)

## 2013-08-18 LAB — IRON AND TIBC
Iron Bind.Cap.(Total): 360 ug/dL
Iron Saturation: 21 %
Iron: 74 ug/dL
Unbound Iron-Bind.Cap.: 286 ug/dL

## 2013-08-18 LAB — URINALYSIS, COMPLETE
Glucose,UR: NEGATIVE mg/dL (ref 0–75)
Nitrite: NEGATIVE
Ph: 6 (ref 4.5–8.0)
Protein: NEGATIVE
RBC,UR: <1 /HPF (ref 0–5)
Specific Gravity: 1.018 (ref 1.003–1.030)
WBC UR: 1 /HPF (ref 0–5)

## 2013-08-18 LAB — CBC
HCT: 31.2 % — ABNORMAL LOW (ref 35.0–47.0)
MCHC: 34.4 g/dL (ref 32.0–36.0)
MCV: 90 fL (ref 80–100)
Platelet: 313 10*3/uL (ref 150–440)
RBC: 3.47 10*6/uL — ABNORMAL LOW (ref 3.80–5.20)
RDW: 13.8 % (ref 11.5–14.5)
WBC: 5.1 10*3/uL (ref 3.6–11.0)

## 2013-08-18 LAB — SALICYLATE LEVEL: Salicylates, Serum: <1.7 mg/dL

## 2013-08-18 LAB — PREGNANCY, URINE: Pregnancy Test, Urine: NEGATIVE m[IU]/mL

## 2013-08-18 LAB — PROTIME-INR
INR: 1
Prothrombin Time: 13.1 s

## 2013-08-18 LAB — TSH: Thyroid Stimulating Horm: 0.873 u[IU]/mL

## 2013-08-19 LAB — COMPREHENSIVE METABOLIC PANEL
Albumin: 2.9 g/dL — ABNORMAL LOW (ref 3.4–5.0)
Alkaline Phosphatase: 279 U/L — ABNORMAL HIGH (ref 50–136)
BUN: 5 mg/dL — ABNORMAL LOW (ref 7–18)
Calcium, Total: 8.2 mg/dL — ABNORMAL LOW (ref 8.5–10.1)
Chloride: 111 mmol/L — ABNORMAL HIGH (ref 98–107)
Creatinine: 0.55 mg/dL — ABNORMAL LOW (ref 0.60–1.30)
EGFR (African American): >60
EGFR (Non-African Amer.): >60
Glucose: 102 mg/dL — ABNORMAL HIGH (ref 65–99)
Osmolality: 277 (ref 275–301)
Potassium: 3.7 mmol/L (ref 3.5–5.1)
Sodium: 140 mmol/L (ref 136–145)
Total Protein: 6 g/dL — ABNORMAL LOW (ref 6.4–8.2)

## 2013-08-19 LAB — DRUG SCREEN, URINE
Barbiturates, Ur Screen: NEGATIVE (ref ?–200)
Cannabinoid 50 Ng, Ur ~~LOC~~: NEGATIVE (ref ?–50)
MDMA (Ecstasy)Ur Screen: NEGATIVE (ref ?–500)
Methadone, Ur Screen: NEGATIVE (ref ?–300)
Opiate, Ur Screen: POSITIVE (ref ?–300)
Tricyclic, Ur Screen: NEGATIVE (ref ?–1000)

## 2013-08-19 LAB — CBC WITH DIFFERENTIAL/PLATELET
Basophil #: 0 10*3/uL (ref 0.0–0.1)
Basophil %: 0.2 %
Eosinophil #: 0 10*3/uL (ref 0.0–0.7)
Eosinophil %: 0.1 %
HGB: 10.4 g/dL — ABNORMAL LOW (ref 12.0–16.0)
Lymphocyte #: 0.9 10*3/uL — ABNORMAL LOW (ref 1.0–3.6)
Lymphocyte %: 15.3 %
MCH: 30.8 pg (ref 26.0–34.0)
Monocyte %: 3.9 %
Neutrophil %: 80.5 %
WBC: 6.1 10*3/uL (ref 3.6–11.0)

## 2013-08-19 LAB — PROTIME-INR: Prothrombin Time: 14.3 secs (ref 11.5–14.7)

## 2013-08-19 LAB — TSH: Thyroid Stimulating Horm: 0.318 u[IU]/mL — ABNORMAL LOW

## 2013-08-20 LAB — COMPREHENSIVE METABOLIC PANEL
Albumin: 2.7 g/dL — ABNORMAL LOW (ref 3.4–5.0)
Anion Gap: 8 (ref 7–16)
Calcium, Total: 8.2 mg/dL — ABNORMAL LOW (ref 8.5–10.1)
Chloride: 112 mmol/L — ABNORMAL HIGH (ref 98–107)
Creatinine: 0.72 mg/dL (ref 0.60–1.30)
Glucose: 79 mg/dL (ref 65–99)
SGOT(AST): 103 U/L — ABNORMAL HIGH (ref 15–37)
SGPT (ALT): 464 U/L — ABNORMAL HIGH (ref 12–78)
Sodium: 142 mmol/L (ref 136–145)
Total Protein: 5.6 g/dL — ABNORMAL LOW (ref 6.4–8.2)

## 2013-08-22 LAB — BETA STREP CULTURE(ARMC)

## 2013-08-23 ENCOUNTER — Ambulatory Visit: Payer: Self-pay | Admitting: Gastroenterology

## 2013-08-23 LAB — HEPATIC FUNCTION PANEL A (ARMC)
Albumin: 4.2 g/dL (ref 3.4–5.0)
Alkaline Phosphatase: 177 U/L — ABNORMAL HIGH (ref 50–136)
Bilirubin, Direct: 0.1 mg/dL (ref 0.00–0.20)
SGOT(AST): 18 U/L (ref 15–37)
SGPT (ALT): 220 U/L — ABNORMAL HIGH (ref 12–78)
Total Protein: 7.3 g/dL (ref 6.4–8.2)

## 2013-08-23 LAB — TSH: Thyroid Stimulating Horm: 0.715 u[IU]/mL

## 2013-08-31 ENCOUNTER — Emergency Department (HOSPITAL_COMMUNITY)
Admission: EM | Admit: 2013-08-31 | Discharge: 2013-08-31 | Disposition: A | Discharge status: Home / Self Care | Payer: BC Managed Care – PPO | Attending: Emergency Medicine | Admitting: Emergency Medicine

## 2013-08-31 DIAGNOSIS — G43909 Migraine, unspecified, not intractable, without status migrainosus: Secondary | ICD-10-CM

## 2013-08-31 DIAGNOSIS — Z87891 Personal history of nicotine dependence: Secondary | ICD-10-CM | POA: Insufficient documentation

## 2013-08-31 DIAGNOSIS — Z91040 Latex allergy status: Secondary | ICD-10-CM | POA: Insufficient documentation

## 2013-08-31 DIAGNOSIS — Z79899 Other long term (current) drug therapy: Secondary | ICD-10-CM | POA: Insufficient documentation

## 2013-08-31 DIAGNOSIS — R209 Unspecified disturbances of skin sensation: Secondary | ICD-10-CM | POA: Insufficient documentation

## 2013-08-31 MED ORDER — MORPHINE SULFATE 4 MG/ML IJ SOLN
4.0000 mg | Freq: Once | INTRAMUSCULAR | Status: AC
  Administered 2013-08-31: 4 mg via INTRAVENOUS
  Filled 2013-08-31: qty 1

## 2013-08-31 MED ORDER — HYDROMORPHONE HCL PF 1 MG/ML IJ SOLN
1.0000 mg | Freq: Once | INTRAMUSCULAR | Status: AC
  Administered 2013-08-31: 1 mg via INTRAVENOUS
  Filled 2013-08-31: qty 1

## 2013-08-31 MED ORDER — SODIUM CHLORIDE 0.9 % IV BOLUS (SEPSIS)
1000.0000 mL | Freq: Once | INTRAVENOUS | Status: AC
  Administered 2013-08-31: 1000 mL via INTRAVENOUS

## 2013-08-31 MED ORDER — PROMETHAZINE HCL 25 MG/ML IJ SOLN
25.0000 mg | Freq: Once | INTRAMUSCULAR | Status: AC
  Administered 2013-08-31: 25 mg via INTRAVENOUS
  Filled 2013-08-31: qty 1

## 2013-08-31 MED ORDER — DEXAMETHASONE SODIUM PHOSPHATE 10 MG/ML IJ SOLN
10.0000 mg | Freq: Once | INTRAMUSCULAR | Status: AC
  Administered 2013-08-31: 10 mg via INTRAVENOUS
  Filled 2013-08-31: qty 1

## 2013-08-31 MED ORDER — DIPHENHYDRAMINE HCL 50 MG/ML IJ SOLN
25.0000 mg | Freq: Once | INTRAMUSCULAR | Status: AC
  Administered 2013-08-31: 25 mg via INTRAVENOUS
  Filled 2013-08-31: qty 1

## 2013-08-31 NOTE — ED Notes (Addendum)
To ED via private vehicle with c/o headache-- migraine--that started last night-- has used phenergan IM, Toradol IM, and topamax a home--- states usually when h/a gets this bad "I usually get IV decadron, phenergan, and dilaudid and it works" -- sees a Insurance account manager in [place]. [name] for migraines, states "can't see out of right eye-- and feel numb on right side-- normal for migraines-- hemiplegic migraines"

## 2013-08-31 NOTE — ED Provider Notes (Signed)
CSN: [id number]     Arrival date & time 08/31/13  0865 History   First MD Initiated Contact with Patient 08/31/13 [id number]     Chief Complaint  Patient presents with  . Migraine   (Consider location/radiation/quality/duration/timing/severity/associated sxs/prior Treatment) HPI Comments: Patient is a 27 year old female with history of hemiplegic migraines presents today with gradually worsening migraine since last night. She reports severe pain on the left side of her face. She has right-sided numbness and reports that it is black on the right side of her eye. She has dots in her vision in her left eye. She has taken IM Toradol and IM Phenergan at home without relief of her symptoms. Last dose of medication was last night. She sees a neurologist in [place], Dr. Catalina Lunger. This is the same as her normal migraines. Generally her migraines improve with IV Decadron, IV Phenergan, IV Dilaudid. She has associated nausea and vomiting. No fevers, chills, neck stiffness.   Patient is a 26 y.o. female presenting with migraines. The history is provided by the patient. No language interpreter was used.  Migraine Associated symptoms include headaches, nausea, numbness and vomiting. Pertinent negatives include no abdominal pain, chest pain, chills or fever.    Past Medical History  Diagnosis Date  . Migraine     hemiplegic  . Migraine    Past Surgical History  Procedure Laterality Date  . Cholecystectomy  2013  . Dental surgery     No family history on file. History  Substance Use Topics  . Smoking status: Former Smoker -- 1.00 packs/day for 5 years  . Smokeless tobacco: Never Used  . Alcohol Use: Yes     Comment: 4/week   OB History   Grav Para Term Preterm Abortions TAB SAB Ect Mult Living   3         3     Obstetric Comments   Age first pregnancy 18     Review of Systems  Constitutional: Negative for fever and chills.  Respiratory: Negative for shortness of breath.   Cardiovascular:  Negative for chest pain.  Gastrointestinal: Positive for nausea and vomiting. Negative for abdominal pain.  Neurological: Positive for numbness and headaches.  All other systems reviewed and are negative.    Allergies  Latex  Home Medications   Current Outpatient Rx  Name  Route  Sig  Dispense  Refill  . clonazePAM (KLONOPIN) 0.5 MG tablet   Oral   Take 0.5 mg by mouth 2 (two) times daily as needed for anxiety.         . DULoxetine (CYMBALTA) 60 MG capsule   Oral   Take 30-60 mg by mouth 2 (two) times daily.          Marland Kitchen ketorolac (TORADOL) 30 MG/ML injection   Intramuscular   Inject 60 mg into the muscle every 6 (six) hours as needed for pain.         . pantoprazole (PROTONIX) 40 MG tablet   Oral   Take 40 mg by mouth daily.         . promethazine (PHENERGAN) 50 MG/ML injection   Intramuscular   Inject 50 mg into the muscle every 6 (six) hours as needed for nausea.          Marland Kitchen topiramate (TOPAMAX) 200 MG tablet   Oral   Take 400 mg by mouth at bedtime.         . traZODone (DESYREL) 100 MG tablet   Oral   Take 200  mg by mouth at bedtime.          BP 113/76  Pulse 103  Temp(Src) 98.1 F (36.7 C) (Oral)  Resp 18  Ht 5\' 4"  (1.626 m)  Wt 140 lb (63.504 kg)  BMI 24.02 kg/m2  SpO2 99% Physical Exam  Nursing note and vitals reviewed. Constitutional: She is oriented to person, place, and time. She appears well-developed and well-nourished. No distress.  HENT:  Head: Normocephalic and atraumatic.  Right Ear: External ear normal.  Left Ear: External ear normal.  Nose: Nose normal.  Mouth/Throat: Uvula is midline and oropharynx is clear and moist. Mucous membranes are dry.  No temporal artery tenderness  Eyes: Conjunctivae and EOM are normal. Pupils are equal, round, and reactive to light.  Dilated pupils bilaterally  Neck: Normal range of motion and phonation normal.  No nuchal rigidity or meningeal signs  Cardiovascular: Normal rate, regular rhythm  and normal heart sounds.   Pulmonary/Chest: Effort normal and breath sounds normal. No stridor. No respiratory distress. She has no wheezes. She has no rales.  Abdominal: Soft. She exhibits no distension.  Musculoskeletal: Normal range of motion.  Neurological: She is alert and oriented to person, place, and time.  Weakness noted on right side. Decreased grip strength.  Skin: Skin is warm and dry. She is not diaphoretic. No erythema.  Psychiatric: She has a normal mood and affect. Her behavior is normal.    ED Course  Procedures (including critical care time)  10:24 AM Patient reevaluated and feels only mildly improved. Still complaints of numbness and headache. She reports that the numbness sometimes takes "awhile to go away".  Patient feels improved and would like to go home when evaluated prior to discharge. She feels the numbness disappearing from her leg.  Labs Review Labs Reviewed - No data to display Imaging Review No results found.  MDM   1. Migraine    Pt HA treated and improved while in ED. Presentation is like pts typical HA and non concerning for [facility], ICH, Meningitis, or temporal arteritis. Pt is afebrile with no nuchal rigidity. Pt is to follow up with PCP to discuss prophylactic medication. Pt verbalizes understanding and is agreeable with plan to dc. Return instructions given. Vital signs stable for discharge. Patient has f/u appointment with neurologist in a couple weeks.   Medications  sodium chloride 0.9 % bolus 1,000 mL (0 mLs Intravenous Stopped 08/31/13 1058)  promethazine (PHENERGAN) injection 25 mg (25 mg Intravenous Given 08/31/13 0937)  dexamethasone (DECADRON) injection 10 mg (10 mg Intravenous Given 08/31/13 0944)  HYDROmorphone (DILAUDID) injection 1 mg (1 mg Intravenous Given 08/31/13 0948)  diphenhydrAMINE (BENADRYL) injection 25 mg (25 mg Intravenous Given 08/31/13 0938)  morphine 4 MG/ML injection 4 mg (4 mg Intravenous Given 08/31/13 1058)     Mora Bellman, PA-C 08/31/13 1203

## 2013-08-31 NOTE — ED Provider Notes (Signed)
Medical screening examination/treatment/procedure(s) were performed by non-physician practitioner and as supervising physician I was immediately available for consultation/collaboration.  Geoffery Lyons, MD 08/31/13 1316

## 2013-09-05 ENCOUNTER — Emergency Department (HOSPITAL_COMMUNITY)
Admission: EM | Admit: 2013-09-05 | Discharge: 2013-09-05 | Disposition: A | Discharge status: Home / Self Care | Payer: BC Managed Care – PPO | Attending: Emergency Medicine | Admitting: Emergency Medicine

## 2013-09-05 ENCOUNTER — Encounter (HOSPITAL_COMMUNITY): Payer: Self-pay | Admitting: *Deleted

## 2013-09-05 DIAGNOSIS — Z87891 Personal history of nicotine dependence: Secondary | ICD-10-CM | POA: Insufficient documentation

## 2013-09-05 DIAGNOSIS — Z79899 Other long term (current) drug therapy: Secondary | ICD-10-CM | POA: Insufficient documentation

## 2013-09-05 DIAGNOSIS — Z91040 Latex allergy status: Secondary | ICD-10-CM | POA: Insufficient documentation

## 2013-09-05 DIAGNOSIS — G43909 Migraine, unspecified, not intractable, without status migrainosus: Secondary | ICD-10-CM | POA: Insufficient documentation

## 2013-09-05 MED ORDER — PROCHLORPERAZINE EDISYLATE 5 MG/ML IJ SOLN: 10.0000 mg | Freq: Once | INTRAMUSCULAR | Status: DC

## 2013-09-05 MED ORDER — PROCHLORPERAZINE EDISYLATE 5 MG/ML IJ SOLN
10.0000 mg | Freq: Four times a day (QID) | INTRAMUSCULAR | Status: DC | PRN
Start: 2013-09-05 — End: 2013-09-05
  Administered 2013-09-05: 10 mg via INTRAVENOUS
  Filled 2013-09-05: qty 2

## 2013-09-05 MED ORDER — DIPHENHYDRAMINE HCL 50 MG/ML IJ SOLN: 25.0000 mg | Freq: Once | INTRAMUSCULAR | Status: DC

## 2013-09-05 MED ORDER — DEXAMETHASONE SODIUM PHOSPHATE 10 MG/ML IJ SOLN: 10.0000 mg | Freq: Once | INTRAMUSCULAR | Status: DC

## 2013-09-05 MED ORDER — HYDROMORPHONE HCL PF 1 MG/ML IJ SOLN
1.0000 mg | Freq: Once | INTRAMUSCULAR | Status: AC
  Administered 2013-09-05: 1 mg via INTRAVENOUS
  Filled 2013-09-05: qty 1

## 2013-09-05 MED ORDER — DEXAMETHASONE SODIUM PHOSPHATE 10 MG/ML IJ SOLN
10.0000 mg | Freq: Once | INTRAMUSCULAR | Status: AC
  Administered 2013-09-05: 10 mg via INTRAVENOUS
  Filled 2013-09-05: qty 1

## 2013-09-05 MED ORDER — HYDROMORPHONE HCL PF 2 MG/ML IJ SOLN: 2.0000 mg | Freq: Once | INTRAMUSCULAR | Status: DC

## 2013-09-05 MED ORDER — DIPHENHYDRAMINE HCL 50 MG/ML IJ SOLN
25.0000 mg | Freq: Once | INTRAMUSCULAR | Status: AC
  Administered 2013-09-05: 25 mg via INTRAVENOUS
  Filled 2013-09-05: qty 1

## 2013-09-05 NOTE — ED Notes (Signed)
Pt states 10/10 headache. No relief with medications. EDP and RN at bedside. Pt instructed to follow up with PCP for further pain management. Pt discharged home. VSS.

## 2013-09-05 NOTE — ED Notes (Signed)
Pt states migraine headache since Friday. Also reports blurred vision to R eye and numbness to entire R side of body. Able to move all extremities. No neurological deficits noted. Speech clear. Also states nausea/vomiting. 9/10 pain at the time. She is alert and oriented x4. No signs of distress noted at present.

## 2013-09-05 NOTE — ED Provider Notes (Signed)
CSN: [id number]     Arrival date & time 09/05/13  1052 History   First MD Initiated Contact with Patient 09/05/13 1113     Chief Complaint  Patient presents with  . Migraine   (Consider location/radiation/quality/duration/timing/severity/associated sxs/prior Treatment) HPI Comments: The patient presents to the ER for evaluation of migraine headache. Patient has a history of chronic migraines. Patient reports throbbing pain on the left side of her head associated with light sensitivity. Symptoms began 2 days ago. Patient reports that she has IM injections of Toradol and Phenergan to use at home. She did administer these to herself yesterday, but has not had any relief. Patient denies any unusual features with this headache. This is a typical migraine for her.  Patient is a 27 y.o. female presenting with migraines.  Migraine Associated symptoms include headaches.    Past Medical History  Diagnosis Date  . Migraine     hemiplegic  . Migraine    Past Surgical History  Procedure Laterality Date  . Cholecystectomy  2013  . Dental surgery     No family history on file. History  Substance Use Topics  . Smoking status: Former Smoker -- 1.00 packs/day for 5 years  . Smokeless tobacco: Never Used  . Alcohol Use: Yes     Comment: 4/week   OB History   Grav Para Term Preterm Abortions TAB SAB Ect Mult Living   3         3     Obstetric Comments   Age first pregnancy 18     Review of Systems  Constitutional: Negative for fever.  HENT: Negative for neck pain.   Neurological: Positive for headaches.  All other systems reviewed and are negative.    Allergies  Latex  Home Medications   Current Outpatient Rx  Name  Route  Sig  Dispense  Refill  . clonazePAM (KLONOPIN) 0.5 MG tablet   Oral   Take 0.5 mg by mouth 2 (two) times daily as needed for anxiety.         . DULoxetine (CYMBALTA) 60 MG capsule   Oral   Take 30-60 mg by mouth 2 (two) times daily.          Marland Kitchen  ketorolac (TORADOL) 30 MG/ML injection   Intramuscular   Inject 60 mg into the muscle every 6 (six) hours as needed for pain.         . pantoprazole (PROTONIX) 40 MG tablet   Oral   Take 40 mg by mouth daily.         . promethazine (PHENERGAN) 50 MG/ML injection   Intramuscular   Inject 50 mg into the muscle every 6 (six) hours as needed for nausea.          Marland Kitchen topiramate (TOPAMAX) 200 MG tablet   Oral   Take 400 mg by mouth at bedtime.         . traZODone (DESYREL) 100 MG tablet   Oral   Take 200 mg by mouth at bedtime.          BP 138/85  Pulse 97  Temp(Src) 98 F (36.7 C) (Oral)  Resp 18  SpO2 97%  LMP 08/24/2013 Physical Exam  Constitutional: She is oriented to person, place, and time. She appears well-developed and well-nourished. No distress.  HENT:  Head: Normocephalic and atraumatic.  Right Ear: Hearing normal.  Left Ear: Hearing normal.  Nose: Nose normal.  Mouth/Throat: Oropharynx is clear and moist and mucous membranes are  normal.  Eyes: Conjunctivae and EOM are normal. Pupils are equal, round, and reactive to light.  Neck: Normal range of motion. Neck supple.  Cardiovascular: Regular rhythm, S1 normal and S2 normal.  Exam reveals no gallop and no friction rub.   No murmur heard. Pulmonary/Chest: Effort normal and breath sounds normal. No respiratory distress. She exhibits no tenderness.  Abdominal: Soft. Normal appearance and bowel sounds are normal. There is no hepatosplenomegaly. There is no tenderness. There is no rebound, no guarding, no tenderness at McBurney's point and negative Murphy's sign. No hernia.  Musculoskeletal: Normal range of motion.  Neurological: She is alert and oriented to person, place, and time. She has normal strength. No cranial nerve deficit or sensory deficit. Coordination normal. GCS eye subscore is 4. GCS verbal subscore is 5. GCS motor subscore is 6.  Skin: Skin is warm, dry and intact. No rash noted. No cyanosis.   Psychiatric: She has a normal mood and affect. Her speech is normal and behavior is normal. Thought content normal.    ED Course  Procedures (including critical care time) Labs Review Labs Reviewed - No data to display Imaging Review No results found.  MDM  Diagnosis: Migraine  Patient presents to the ER for evaluation of migraine headache. Patient reports that her headache is typical for her chronic migraines. She has used her IM injection of Toradol and Phenergan without improvement. This is her third visit to the ER for similar complaints for this patient. Unfortunately she is ready use Toradol and Phenergan IM which are very good rescue drugs for migraine. Reviewing her records she has been given narcotic analgesia the last 2 times. It is unlikely that the patient would report any improvement from repeat Toradol and Phenergan today, no other option but to give her pain medication at this point. She is encouraged to followup with neurology.    Gilda Crease, MD 09/05/13 1136

## 2013-09-05 NOTE — ED Notes (Signed)
Migraine since Friday and has been taking rescue.  Pt has numbness on right face down to right knee and vision in right eye is messed up

## 2013-09-07 ENCOUNTER — Emergency Department: Payer: Self-pay | Admitting: Emergency Medicine

## 2013-09-08 ENCOUNTER — Encounter: Payer: Self-pay | Admitting: Neurology

## 2013-09-08 ENCOUNTER — Ambulatory Visit (INDEPENDENT_AMBULATORY_CARE_PROVIDER_SITE_OTHER): Payer: BC Managed Care – PPO | Admitting: Neurology

## 2013-09-08 ENCOUNTER — Ambulatory Visit (INDEPENDENT_AMBULATORY_CARE_PROVIDER_SITE_OTHER): Payer: Self-pay | Admitting: Neurology

## 2013-09-08 VITALS — BP 115/82 | HR 104 | Temp 98.9°F | Ht 69.0 in | Wt 151.0 lb

## 2013-09-08 DIAGNOSIS — G43809 Other migraine, not intractable, without status migrainosus: Secondary | ICD-10-CM | POA: Insufficient documentation

## 2013-09-08 DIAGNOSIS — Z0289 Encounter for other administrative examinations: Secondary | ICD-10-CM

## 2013-09-08 DIAGNOSIS — G43019 Migraine without aura, intractable, without status migrainosus: Secondary | ICD-10-CM | POA: Insufficient documentation

## 2013-09-08 MED ORDER — KETOROLAC TROMETHAMINE 60 MG/2ML IM SOLN: 60.0000 mg | Freq: Once | INTRAMUSCULAR | Status: DC

## 2013-09-08 MED ORDER — KETOROLAC TROMETHAMINE 60 MG/2ML IM SOLN
60.0000 mg | Freq: Once | INTRAMUSCULAR | Status: AC
  Administered 2013-09-08: 60 mg via INTRAMUSCULAR

## 2013-09-08 MED ORDER — PROMETHAZINE HCL 25 MG/ML IJ SOLN
25.0000 mg | Freq: Once | INTRAMUSCULAR | Status: AC
  Administered 2013-09-08: 25 mg via INTRAMUSCULAR

## 2013-09-08 MED ORDER — AMITRIPTYLINE HCL 25 MG PO TABS: 25.0000 mg | ORAL_TABLET | Freq: Every day | ORAL | Status: DC

## 2013-09-08 MED ORDER — PROMETHAZINE HCL 25 MG/ML IJ SOLN: 25.0000 mg | Freq: Once | INTRAMUSCULAR | Status: DC

## 2013-09-08 NOTE — Progress Notes (Signed)
Patient here seeing Dr. Pearlean Brownie.  Order for Toradol 60mg  IM and Phenergan 25mg  IM.  Under aseptic technique Toradol 60mg  given IM in right gluteal, and Phenergan 25mg  diluted with 5cc NS given IM in left gluteal.  Tolerated well.

## 2013-09-08 NOTE — Patient Instructions (Signed)
Patient returning home with driver.

## 2013-09-08 NOTE — Patient Instructions (Addendum)
Treat acute headache  with 60 mg Toradol IM and 25 mg Phenergan IM injection today. I gave her samples of Zomig nasal spray to try and onset of headache for symptomatic relief. Start amitriptyline 25 mg at night for headache prophylaxis and increase up to 2 weeks if necessary. Taper Topamax to 200 mg daily after 2 weeks then 100 mg daily for 2 weeks and discontinue. Check MRI scan of the brain and MRA of the brain. Return for followup in 6 weeks. Keep a strict headache diary and 2 daily next is exercises and participate in activities for stress relaxation.

## 2013-09-08 NOTE — Progress Notes (Signed)
[facility] [address] [place]. [place] [address] [phone]       OFFICE CONSULT NOTE  [name] Date of Birth:  May 14, 1986 Medical Record Number:  [id number]   Referring MD:  Jaci Carrel, MD  Reason for Referral:  Headaches  HPI: 27 year Caucasian lady with migraine headaches since 6 th grade in school off and on which have increased in last 5 months to 1-2 per week.These are severe throbbing periorbital and occipital, present upon awakening and last 1-2 days.there is nausea, vomitting and worsening with activity and relief with sleep. 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 She is currently on Topamax 400 mg without clear efficacy.She has not tried elavil or zanaflex for prophylaxis.she was seen recently in [facility] with several days of headache, dehydration.She has had brain imaging studies in past but my review of [facility] imaging records and our office notes do not mention any reference to these.She takes toradol 60 mg and phenergan 25 mg im self injections for symptomatic relief 1-2 times per month  ROS:   29 system review of systems is positive for headache,blurred vision, weakness, lack of sleep  PMH:  Past Medical History  Diagnosis Date  . Migraine     hemiplegic  . Migraine     Social History:  History   Social History  . Marital Status: Married    Spouse Name: dan    Number of Children: 3  . Years of Education: college   Occupational  History  . N/A    Social History Main Topics  . Smoking status: Former Smoker -- 1.00 packs/day for 5 years  . Smokeless tobacco: Never Used  . Alcohol Use: Yes     Comment: 4/week  . Drug Use: No  . Sexual Activity: No   Other Topics Concern  . Not on file   Social History Narrative  . No narrative on file    Medications:   Current Outpatient Prescriptions on File Prior to Visit  Medication Sig Dispense Refill  . clonazePAM (KLONOPIN) 0.5 MG tablet Take 0.5 mg by mouth 2 (two) times daily as needed for anxiety.      . DULoxetine (CYMBALTA) 60 MG capsule Take 30-60 mg by mouth 2 (two) times daily.       Marland Kitchen ketorolac (TORADOL) 30 MG/ML injection Inject 60 mg into the muscle every 6 (six) hours as needed for pain.      . pantoprazole (PROTONIX) 40 MG tablet Take 40 mg by mouth daily.      Marland Kitchen topiramate (TOPAMAX) 200 MG tablet Take 400 mg by mouth at bedtime.       No current facility-administered medications on file prior to visit.    Allergies:   Allergies  Allergen Reactions  . Latex Rash    Physical Exam General: well developed, well nourished, seated, in   evident distress due to headache Head: head normocephalic and atraumatic. Orohparynx benign Neck: supple with no carotid or supraclavicular bruits Cardiovascular: regular rate and rhythm, no  murmurs Musculoskeletal: no deformity Skin:  no rash/petichiae Vascular:  Normal pulses all extremities Blood pressure 115/82, pulse 104, temperature 98.9 F (37.2 C), temperature source Oral, height 5\' 9"  (1.753 m), weight 151 lb (68.493 kg), last menstrual period 08/24/2013.  Vitals  Awake and fully alert. Oriented to place and time. Recent and remote memory intact. Attention span, concentration and fund of knowledge appropriate. Mood and affect appropriate.  Cranial Nerves: Fundoscopic exam reveals sharp disc margins. Pupils equal, briskly reactive to light. Extraocular movements full without nystagmus. Visual fields full to  confrontation. Hearing intact. Facial sensation intact. Face, tongue, palate moves normally and symmetrically.  Motor: Normal bulk and tone. Normal strength in all tested extremity muscles. Sensory.: diminished touch, pinprick on right lower face, chest ,upper extremity and right thigh upto knee but with splitting of midline.splits vibration over forehead. Coordination: Rapid alternating movements normal in all extremities. Finger-to-nose and heel-to-shin performed accurately bilaterally. Gait and Station: Arises from chair without difficulty. Stance is normal. Gait demonstrates normal stride length and balance . Able to heel, toe and tandem walk without difficulty.  Reflexes: 1+ and symmetric. Toes downgoing.      ASSESSMENT: 27 year  Caucasian lady with long standing refractory migraines with complicated migraine features    PLAN: I had a long discussion with patient about her migraines, treatment options and answered questions Treat acute headache  with 60 mg Toradol IM and 25 mg Phenergan IM injection today. I gave her samples of Zomig nasal spray to try and onset of headache for symptomatic relief. Start amitriptyline 25 mg at night for headache prophylaxis and increase up to 2 weeks if necessary. Taper Topamax to 200 mg daily after 2 weeks then 100 mg daily for 2 weeks and discontinue. Check MRI scan of the brain and MRA of the brain. Return for followup in 6 weeks. Keep a strict headache diary and 2 daily next is exercises and participate in activities for stress relaxation.

## 2013-09-10 ENCOUNTER — Telehealth: Payer: Self-pay | Admitting: Neurology

## 2013-09-10 ENCOUNTER — Encounter (HOSPITAL_COMMUNITY): Payer: Self-pay | Admitting: Cardiology

## 2013-09-10 ENCOUNTER — Emergency Department (HOSPITAL_COMMUNITY)
Admission: EM | Admit: 2013-09-10 | Discharge: 2013-09-10 | Disposition: A | Discharge status: Home / Self Care | Payer: BC Managed Care – PPO | Attending: Emergency Medicine | Admitting: Emergency Medicine

## 2013-09-10 DIAGNOSIS — R112 Nausea with vomiting, unspecified: Secondary | ICD-10-CM | POA: Insufficient documentation

## 2013-09-10 DIAGNOSIS — H53459 Other localized visual field defect, unspecified eye: Secondary | ICD-10-CM | POA: Insufficient documentation

## 2013-09-10 DIAGNOSIS — Z79899 Other long term (current) drug therapy: Secondary | ICD-10-CM | POA: Insufficient documentation

## 2013-09-10 DIAGNOSIS — Z87891 Personal history of nicotine dependence: Secondary | ICD-10-CM | POA: Insufficient documentation

## 2013-09-10 DIAGNOSIS — H5700 Unspecified anomaly of pupillary function: Secondary | ICD-10-CM | POA: Insufficient documentation

## 2013-09-10 DIAGNOSIS — R5381 Other malaise: Secondary | ICD-10-CM | POA: Insufficient documentation

## 2013-09-10 DIAGNOSIS — Z91040 Latex allergy status: Secondary | ICD-10-CM | POA: Insufficient documentation

## 2013-09-10 DIAGNOSIS — R209 Unspecified disturbances of skin sensation: Secondary | ICD-10-CM | POA: Insufficient documentation

## 2013-09-10 DIAGNOSIS — R630 Anorexia: Secondary | ICD-10-CM | POA: Insufficient documentation

## 2013-09-10 DIAGNOSIS — G43909 Migraine, unspecified, not intractable, without status migrainosus: Secondary | ICD-10-CM

## 2013-09-10 MED ORDER — PROCHLORPERAZINE EDISYLATE 5 MG/ML IJ SOLN: 10.0000 mg | Freq: Once | INTRAMUSCULAR | Status: DC

## 2013-09-10 MED ORDER — HYDROMORPHONE HCL PF 1 MG/ML IJ SOLN
1.0000 mg | Freq: Once | INTRAMUSCULAR | Status: AC
  Administered 2013-09-10: 1 mg via INTRAVENOUS
  Filled 2013-09-10: qty 1

## 2013-09-10 MED ORDER — SODIUM CHLORIDE 0.9 % IV BOLUS (SEPSIS)
1000.0000 mL | Freq: Once | INTRAVENOUS | Status: AC
  Administered 2013-09-10: 1000 mL via INTRAVENOUS

## 2013-09-10 MED ORDER — DIPHENHYDRAMINE HCL 50 MG/ML IJ SOLN: 25.0000 mg | Freq: Once | INTRAMUSCULAR | Status: DC

## 2013-09-10 MED ORDER — DEXAMETHASONE SODIUM PHOSPHATE 10 MG/ML IJ SOLN
10.0000 mg | Freq: Once | INTRAMUSCULAR | Status: AC
  Administered 2013-09-10: 10 mg via INTRAVENOUS
  Filled 2013-09-10: qty 1

## 2013-09-10 MED ORDER — KETOROLAC TROMETHAMINE 30 MG/ML IJ SOLN: 30.0000 mg | Freq: Once | INTRAMUSCULAR | Status: DC

## 2013-09-10 MED ORDER — PROMETHAZINE HCL 25 MG/ML IJ SOLN
25.0000 mg | Freq: Once | INTRAMUSCULAR | Status: AC
  Administered 2013-09-10: 25 mg via INTRAVENOUS
  Filled 2013-09-10: qty 1

## 2013-09-10 NOTE — ED Notes (Signed)
Pt tearful sts these medications do not work for her migraines and pt needs dilaudid, phenegran and decadron to break headaches. Abigail PA-C made aware. sts will try this and has called to consult with dr. Brandon Melnick. Pt updated. Upset and tearful. Requesting to speak to PA-C. Abigail made aware.

## 2013-09-10 NOTE — ED Notes (Addendum)
Abigal PA-C at bedside. 

## 2013-09-10 NOTE — ED Provider Notes (Signed)
CSN: [id number]     Arrival date & time 09/10/13  1110 History   First MD Initiated Contact with Patient 09/10/13 1206     Chief Complaint  Patient presents with  . Migraine   (Consider location/radiation/quality/duration/timing/severity/associated sxs/prior Treatment) Patient is a 27 y.o. female presenting with migraines. The history is provided by the patient and medical records. No language interpreter was used.  Migraine This is a chronic problem. The current episode started today. The problem occurs every several days. The problem has been unchanged. Associated symptoms include anorexia, fatigue, headaches, nausea, numbness (right side of face and body), a visual change (spots in R visiual field) and vomiting. Pertinent negatives include no abdominal pain, arthralgias, change in bowel habit, chest pain, chills, congestion, coughing, diaphoresis, fever, joint swelling, myalgias, neck pain, rash, sore throat, swollen glands, urinary symptoms, vertigo or weakness. Exacerbated by: Lights, sound. Treatments tried: topomax, zomig. The treatment provided no relief.    Past Medical History  Diagnosis Date  . Migraine     hemiplegic  . Migraine    Past Surgical History  Procedure Laterality Date  . Cholecystectomy  2013  . Dental surgery     Family History  Problem Relation Age of Onset  . Cancer - Other Mother 87  . Hypertension Mother   . Hypertension Father   . Breast cancer Maternal Grandmother    History  Substance Use Topics  . Smoking status: Former Smoker -- 1.00 packs/day for 5 years  . Smokeless tobacco: Never Used  . Alcohol Use: Yes     Comment: 4/week   OB History   Grav Para Term Preterm Abortions TAB SAB Ect Mult Living   3         3     Obstetric Comments   Age first pregnancy 18     Review of Systems  Constitutional: Positive for fatigue. Negative for fever, chills and diaphoresis.  HENT: Negative for congestion, sore throat and neck pain.   Respiratory:  Negative for cough.   Cardiovascular: Negative for chest pain.  Gastrointestinal: Positive for nausea, vomiting and anorexia. Negative for abdominal pain and change in bowel habit.  Musculoskeletal: Negative for myalgias, joint swelling and arthralgias.  Skin: Negative for rash.  Neurological: Positive for numbness (right side of face and body) and headaches. Negative for vertigo and weakness.    Allergies  Latex  Home Medications   Current Outpatient Rx  Name  Route  Sig  Dispense  Refill  . amitriptyline (ELAVIL) 25 MG tablet   Oral   Take 1 tablet (25 mg total) by mouth at bedtime.   60 tablet   1   . clonazePAM (KLONOPIN) 0.5 MG tablet   Oral   Take 0.5 mg by mouth 2 (two) times daily as needed for anxiety.         . DULoxetine (CYMBALTA) 60 MG capsule   Oral   Take 30-60 mg by mouth 2 (two) times daily.          Marland Kitchen ketorolac (TORADOL) 30 MG/ML injection   Intramuscular   Inject 60 mg into the muscle every 6 (six) hours as needed for pain.         . pantoprazole (PROTONIX) 40 MG tablet   Oral   Take 40 mg by mouth daily.         Marland Kitchen topiramate (TOPAMAX) 200 MG tablet   Oral   Take 200 mg by mouth at bedtime.          Marland Kitchen  zolmitriptan (ZOMIG) 5 MG nasal solution   Nasal   Place 1 spray into the nose as needed for migraine.          BP 108/70  Pulse 95  Temp(Src) 98.4 F (36.9 C) (Oral)  Resp 16  Ht 5\' 4"  (1.626 m)  Wt 148 lb (67.132 kg)  BMI 25.39 kg/m2  SpO2 98%  LMP 08/24/2013 Physical Exam  Constitutional: She is oriented to person, place, and time. She appears well-developed and well-nourished. No distress.  HENT:  Head: Normocephalic and atraumatic.  Eyes: Conjunctivae and EOM are normal. Pupils are equal, round, and reactive to light. No scleral icterus.  Neck: Normal range of motion.  Cardiovascular: Normal rate, regular rhythm and normal heart sounds.  Exam reveals no gallop and no friction rub.   No murmur heard. Pulmonary/Chest:  Effort normal and breath sounds normal. No respiratory distress.  Abdominal: Soft. Bowel sounds are normal. She exhibits no distension and no mass. There is no tenderness. There is no guarding.  Neurological: She is alert and oriented to person, place, and time. She has normal reflexes.  Mydriatic pupils.  Skin: Skin is warm and dry. She is not diaphoretic.    ED Course  Procedures (including critical care time) Labs Review Labs Reviewed - No data to display Imaging Review No results found.  MDM   1. Migraine    Patient here with here with her typical migraine. SHe has been here very frequently over the past month. She is seen by Dr. Pearlean Brownie, who is switching her to a new medication and she is using zomig today which gave her no relief. The patient states that she can have no more toradol this  month and that dilaudid is the only thing that breaks her migraine. I have placed a call to Dr. Pearlean Brownie as I would like to consult on the patient since she is coming so frequently for IV narcotics  3:11 PM Medications  sodium chloride 0.9 % bolus 1,000 mL (0 mLs Intravenous Stopped 09/10/13 1457)  HYDROmorphone (DILAUDID) injection 1 mg (1 mg Intravenous Given 09/10/13 1331)  dexamethasone (DECADRON) injection 10 mg (10 mg Intravenous Given 09/10/13 1331)  promethazine (PHENERGAN) injection 25 mg (25 mg Intravenous Given 09/10/13 1331)   Patient  meds listed above. I did page Dr Pearlean Brownie twice to try and consult on narcotic use with the patient as i am hesitant to give narcotics for migraine. I did attempt to give compazine, toradol and benadryl, however the patient refused the medication and began paging Dr. Pearlean Brownie herself. In ordere to speed up care and relief of her symptoms I agreed to treat with he medications the patient requested.  The patient is reporting significan t relief of her sxs and requests to go home.    Presentation is like pts typical HA and non concerning for [facility], ICH, Meningitis, or  temporal arteritis. Pt is afebrile with no focal neuro deficits, nuchal rigidity, or change in vision. Pt is to follow up with PCP to discuss  medication. Pt verbalizes understanding and is agreeable with plan to dc.    Arthor Captain, PA-C 09/10/13 1516

## 2013-09-10 NOTE — Telephone Encounter (Signed)
I took TC from pt relating to her having a migraine this am.  Toradol and Phenergan did not help on 09-08-13.  Pt took Zomig nasal spray x 2 and this has not helped.  She is having decreased vision R eye, numbness near R eye down to R knee.  Level 10 w/ nausea and vomiting.  Has taken topamax.  Latex allergy - rash.  [phone].  She stated from previous times she has been given solumedrol per Dr. Sharene Skeans.  Please advise WID

## 2013-09-10 NOTE — ED Provider Notes (Signed)
Medical screening examination/treatment/procedure(s) were performed by non-physician practitioner and as supervising physician I was immediately available for consultation/collaboration.   Gavin Pound. Jaylend Reiland, MD 09/10/13 1519

## 2013-09-10 NOTE — ED Notes (Addendum)
Pt reports a migraine that started yesterday. States that she has hx of the same and sees Dr. Pearlean Brownie, told to come here for treatment. Used her medication without any relief. Pt reports black spots in her right eye. No neuro deficits noted. Reports numbness on the right sided.

## 2013-09-10 NOTE — Telephone Encounter (Signed)
Mother came in, pt in car.  I went out to speak to her.  Pt has been to ED twice this week.  Had some relief after seeing Dr. Pearlean Brownie with injections.  Woke up this am with n/v, headache level 10.  I relayed that I sent message to Dr.Penumalli, WID, but had not been able to get to him, as he is seeing pts.  I recommended that she take her to ED.  Dr. Pearlean Brownie is her MD here and is at [facility] as stroke MD.  She stated that she would let them know this.  I told her that even though relating this, did not mean they would contact him.  She would try.   If they have questions, could call.  I could sent centricity notes if needed.   She left and would go to ED.

## 2013-09-13 ENCOUNTER — Emergency Department: Payer: Self-pay | Admitting: Emergency Medicine

## 2013-09-16 ENCOUNTER — Encounter (HOSPITAL_COMMUNITY): Payer: Self-pay | Admitting: *Deleted

## 2013-09-16 ENCOUNTER — Emergency Department (HOSPITAL_COMMUNITY)
Admission: EM | Admit: 2013-09-16 | Discharge: 2013-09-16 | Disposition: A | Discharge status: Home / Self Care | Payer: BC Managed Care – PPO | Attending: Emergency Medicine | Admitting: Emergency Medicine

## 2013-09-16 ENCOUNTER — Emergency Department: Payer: Self-pay | Admitting: Emergency Medicine

## 2013-09-16 ENCOUNTER — Telehealth: Payer: Self-pay | Admitting: Neurology

## 2013-09-16 DIAGNOSIS — Z79899 Other long term (current) drug therapy: Secondary | ICD-10-CM | POA: Insufficient documentation

## 2013-09-16 DIAGNOSIS — R110 Nausea: Secondary | ICD-10-CM

## 2013-09-16 DIAGNOSIS — G43909 Migraine, unspecified, not intractable, without status migrainosus: Secondary | ICD-10-CM

## 2013-09-16 DIAGNOSIS — Z87891 Personal history of nicotine dependence: Secondary | ICD-10-CM | POA: Insufficient documentation

## 2013-09-16 DIAGNOSIS — Z91040 Latex allergy status: Secondary | ICD-10-CM | POA: Insufficient documentation

## 2013-09-16 MED ORDER — MORPHINE SULFATE 4 MG/ML IJ SOLN
4.0000 mg | Freq: Once | INTRAMUSCULAR | Status: AC
  Administered 2013-09-16: 4 mg via INTRAVENOUS
  Filled 2013-09-16: qty 1

## 2013-09-16 MED ORDER — DEXAMETHASONE SODIUM PHOSPHATE 10 MG/ML IJ SOLN
10.0000 mg | Freq: Once | INTRAMUSCULAR | Status: AC
  Administered 2013-09-16: 10 mg via INTRAVENOUS
  Filled 2013-09-16: qty 1

## 2013-09-16 MED ORDER — ONDANSETRON HCL 4 MG/2ML IJ SOLN
4.0000 mg | Freq: Once | INTRAMUSCULAR | Status: AC
  Administered 2013-09-16: 4 mg via INTRAVENOUS
  Filled 2013-09-16: qty 2

## 2013-09-16 MED ORDER — SODIUM CHLORIDE 0.9 % IV BOLUS (SEPSIS)
1000.0000 mL | Freq: Once | INTRAVENOUS | Status: AC
  Administered 2013-09-16: 1000 mL via INTRAVENOUS

## 2013-09-16 MED ORDER — KETOROLAC TROMETHAMINE 30 MG/ML IJ SOLN
30.0000 mg | Freq: Once | INTRAMUSCULAR | Status: AC
  Administered 2013-09-16: 30 mg via INTRAVENOUS
  Filled 2013-09-16: qty 1

## 2013-09-16 MED ORDER — METOCLOPRAMIDE HCL 5 MG/ML IJ SOLN
10.0000 mg | Freq: Once | INTRAMUSCULAR | Status: AC
  Administered 2013-09-16: 10 mg via INTRAVENOUS
  Filled 2013-09-16: qty 2

## 2013-09-16 NOTE — Telephone Encounter (Signed)
This pt is calling about her medication Zomig not working and looking at notes, her ED visits have been frequent.  She is asking about what is next.

## 2013-09-16 NOTE — ED Provider Notes (Signed)
Medical screening examination/treatment/procedure(s) were performed by non-physician practitioner and as supervising physician I was immediately available for consultation/collaboration.  Shon Baton, MD 09/16/13 [phone]

## 2013-09-16 NOTE — ED Notes (Signed)
Pt reports having migraine for 1.5 days and no relief with migraine meds. Having numbness and vision loss to right eye, which is normal with her migraines. Reports n/v.

## 2013-09-16 NOTE — ED Provider Notes (Signed)
CSN: [id number]     Arrival date & time 09/16/13  0919 History   First MD Initiated Contact with Patient 09/16/13 [phone]     Chief Complaint  Patient presents with  . Migraine   (Consider location/radiation/quality/duration/timing/severity/associated sxs/prior Treatment) Patient is a 27 y.o. female presenting with migraines. The history is provided by the patient and medical records.  Migraine Associated symptoms include headaches, nausea and vomiting.   Patient presents to the ED for typical migraine. States she has had present migraine for 1.5 days. Has associated photophobia, visual disturbance of right eye, and right facial numbness. She states the symptoms are typical for her normal migraine. Denies any phonophobia, aura, dizziness, tinnitus, difficulty concentrating, or changes in speech. Patient also notes some nausea and vomiting due to pain. Denies any abdominal pain, diarrhea, or urinary symptoms. No recent fevers, sweats, or chills. Patient has been taking her home migraine medications without significant relief.  Medical records reviewed-- this is patient's fourth ED visit this month for migraine. At each visit she specifically requests narcotics and a combination with Phenergan.  Pt is followed by Dr. Pearlean Brownie and has been scheduled for OP MRI for further evaluation of recurrent migraines.  Past Medical History  Diagnosis Date  . Migraine     hemiplegic  . Migraine    Past Surgical History  Procedure Laterality Date  . Cholecystectomy  2013  . Dental surgery     Family History  Problem Relation Age of Onset  . Cancer - Other Mother 19  . Hypertension Mother   . Hypertension Father   . Breast cancer Maternal Grandmother    History  Substance Use Topics  . Smoking status: Former Smoker -- 1.00 packs/day for 5 years  . Smokeless tobacco: Never Used  . Alcohol Use: Yes     Comment: 4/week   OB History   Grav Para Term Preterm Abortions TAB SAB Ect Mult Living   3          3     Obstetric Comments   Age first pregnancy 18     Review of Systems  Eyes: Positive for photophobia and visual disturbance.  Gastrointestinal: Positive for nausea and vomiting.  Neurological: Positive for headaches.  All other systems reviewed and are negative.    Allergies  Latex  Home Medications   Current Outpatient Rx  Name  Route  Sig  Dispense  Refill  . DULoxetine (CYMBALTA) 60 MG capsule   Oral   Take 30-60 mg by mouth 2 (two) times daily.          Marland Kitchen ketorolac (TORADOL) 30 MG/ML injection   Intramuscular   Inject 60 mg into the muscle every 6 (six) hours as needed for pain.         . pantoprazole (PROTONIX) 40 MG tablet   Oral   Take 40 mg by mouth daily.         Marland Kitchen topiramate (TOPAMAX) 200 MG tablet   Oral   Take 200 mg by mouth at bedtime.          Marland Kitchen zolmitriptan (ZOMIG) 5 MG nasal solution   Nasal   Place 1 spray into the nose as needed for migraine.          BP 120/96  Pulse 110  Temp(Src) 97.6 F (36.4 C) (Oral)  Resp 20  Ht 5\' 4"  (1.626 m)  Wt 140 lb (63.504 kg)  BMI 24.02 kg/m2  SpO2 96%  LMP 08/24/2013  Physical Exam  Nursing note and vitals reviewed. Constitutional: She is oriented to person, place, and time. She appears well-developed and well-nourished. No distress.  HENT:  Head: Normocephalic and atraumatic.  Mouth/Throat: Oropharynx is clear and moist.  Eyes: Conjunctivae and EOM are normal. Pupils are equal, round, and reactive to light.  Neck: Normal range of motion. Neck supple. No rigidity.  No meningeal signs  Cardiovascular: Normal rate, regular rhythm and normal heart sounds.   Pulmonary/Chest: Effort normal and breath sounds normal. No respiratory distress. She has no wheezes.  Abdominal: Soft. Bowel sounds are normal. There is no tenderness. There is no guarding.  Musculoskeletal: Normal range of motion.  Neurological: She is alert and oriented to person, place, and time. She has normal strength. She  displays no tremor. No cranial nerve deficit or sensory deficit. She displays no seizure activity. Gait normal.  No focal neuro deficits appreciated  Skin: Skin is warm and dry. She is not diaphoretic.  Psychiatric: She has a normal mood and affect.    ED Course  Procedures (including critical care time) Labs Review Labs Reviewed - No data to display Imaging Review No results found.  MDM   1. Migraine   2. Nausea    Headache with typical migraine symptoms.  I doubt ICH, SAH, TIA, stroke, or meningitis.  Headache improved with IVF, decadron, reglan, and morphine.  Instructed to continue taking home meds as directed.  Pt will FU with Dr. Pearlean Brownie.  Discussed plan with pt, she agreed.  Return precautions advised.  Garlon Hatchet, PA-C 09/16/13 1422

## 2013-09-19 ENCOUNTER — Emergency Department: Payer: Self-pay | Admitting: Emergency Medicine

## 2013-09-20 ENCOUNTER — Emergency Department: Payer: Self-pay | Admitting: Emergency Medicine

## 2013-09-20 ENCOUNTER — Telehealth: Payer: Self-pay | Admitting: Neurology

## 2013-09-20 NOTE — Telephone Encounter (Signed)
Pt coming tomorrow with L Lam/NP for evaluation.

## 2013-09-20 NOTE — Telephone Encounter (Signed)
Return pt's call about her migraines and throwing up. Pt did not answer and there was not a voice mail setup to leave message.

## 2013-09-20 NOTE — Telephone Encounter (Signed)
Return pt's call and made appt with lynn lam for 09/21/13 at 3:30pm. Pt agreed to come in to see lynn lam, NP.

## 2013-09-21 ENCOUNTER — Other Ambulatory Visit: Payer: Self-pay | Admitting: Neurology

## 2013-09-21 ENCOUNTER — Encounter: Payer: Self-pay | Admitting: Nurse Practitioner

## 2013-09-21 ENCOUNTER — Ambulatory Visit: Payer: BC Managed Care – PPO

## 2013-09-21 ENCOUNTER — Ambulatory Visit (INDEPENDENT_AMBULATORY_CARE_PROVIDER_SITE_OTHER): Payer: BC Managed Care – PPO | Admitting: Nurse Practitioner

## 2013-09-21 VITALS — BP 108/72 | HR 117 | Temp 98.5°F | Ht 65.75 in | Wt 158.0 lb

## 2013-09-21 DIAGNOSIS — R112 Nausea with vomiting, unspecified: Secondary | ICD-10-CM

## 2013-09-21 DIAGNOSIS — G43111 Migraine with aura, intractable, with status migrainosus: Secondary | ICD-10-CM

## 2013-09-21 MED ORDER — TIZANIDINE HCL 2 MG PO CAPS: 2.0000 mg | ORAL_CAPSULE | Freq: Three times a day (TID) | ORAL | Status: DC | PRN

## 2013-09-21 MED ORDER — AMITRIPTYLINE HCL 25 MG PO TABS: 50.0000 mg | ORAL_TABLET | Freq: Every day | ORAL | Status: DC

## 2013-09-21 MED ORDER — VALPROATE SODIUM 500 MG/5ML IV SOLN
1000.0000 mg | INTRAVENOUS | Status: DC
  Administered 2013-09-21: 1000 mg via INTRAVENOUS

## 2013-09-21 MED ORDER — METHYLPREDNISOLONE 4 MG PO KIT: PACK | ORAL | Status: DC

## 2013-09-21 MED ORDER — KETOROLAC TROMETHAMINE 60 MG/2ML IM SOLN
60.0000 mg | Freq: Once | INTRAMUSCULAR | Status: AC
  Administered 2013-09-21: 60 mg via INTRAMUSCULAR

## 2013-09-21 MED ORDER — KETOROLAC TROMETHAMINE 30 MG/ML IM SOLN: 30.0000 mg | Freq: Once | INTRAMUSCULAR | Status: DC

## 2013-09-21 MED ORDER — PROCHLORPERAZINE EDISYLATE 5 MG/ML IJ SOLN: 10.0000 mg | Freq: Once | INTRAMUSCULAR | Status: DC

## 2013-09-21 MED ORDER — PROMETHAZINE HCL 25 MG/ML IJ SOLN
25.0000 mg | Freq: Once | INTRAMUSCULAR | Status: AC
  Administered 2013-09-21: 25 mg via INTRAVENOUS

## 2013-09-21 MED ORDER — SODIUM CHLORIDE 0.9 % IV SOLN
100.0000 mL | INTRAVENOUS | Status: AC
  Administered 2013-09-21: 100 mL via INTRAVENOUS

## 2013-09-21 NOTE — Progress Notes (Addendum)
Administered toradol 60 mg (30mg /ml) IM in L upper/outer thigh and Promethazine 25 mg/ml in R upper/outer thigh under aseptic technique. Tolerated well. Mother is here to drive. Pt. Has been vomiting. Encouraged to take small sips of water or broth every 5-10 minutes for one hour and then increase, especially if finds some relief from phenergan and toradol.   Patient continued to have pain 9/10. Administered Valproate 1000 mg followed by another 500 mg via 100 ml 0.9% NS IV and a second 100 ML 0.9% NS IV.

## 2013-09-21 NOTE — Patient Instructions (Addendum)
No driving. Be sure to drink fluids when stomach symptoms resolve. Call in morning as needed

## 2013-09-21 NOTE — Patient Instructions (Addendum)
Increase Elavil (amitriptalyne to 50 mg each night.  (2 tablets).  We sent in prescription for a Medrol Dose Pack.  Follow package directions, taking 6 tablets day 1 and reduce each day by 1 pill.  Take Zanaflex, 2 mg, 1 tablet three times a day as needed for tension headache.  We gave Toradol and Phenergan IM injection and Depacon IV infusion in office today.  Check MRI scan of the brain and MRA of the brain.  Return for followup in 6 weeks.  Keep a strict headache diary and 2 daily next is exercises and participate in activities for stress relaxation.

## 2013-09-21 NOTE — Progress Notes (Signed)
[facility]  PATIENT: [name] DOB: 1986-01-01   REASON FOR VISIT: follow up HISTORY FROM: patient  HISTORY OF PRESENT ILLNESS: 43 year Caucasian lady with migraine headaches since 6 th grade in school off and on which have increased in last 5 months to 1-2 per week.These are severe throbbing periorbital and occipital, present upon awakening and last 1-2 days.there is nausea, vomitting and worsening with activity and relief with sleep. 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 She is currently on Topamax 400 mg without clear efficacy.She has not tried elavil or zanaflex for prophylaxis.she was seen recently in [facility] with several days of headache, dehydration.She has had brain imaging studies in past but my review of [facility] imaging records and our office notes do not mention any reference to these.She takes toradol 60 mg and phenergan 25 mg im self injections for symptomatic relief 1-2 times per month   UPDATE 09/21/13 (LL): Patient comes in for acute visit, having severe Migraine for 3 days.  Last ER visit was 09/16/13 in which she received headache Toradol, Zofran, Morphine, Decadron, and 1,000 mL NS bolus.  She states she cannot even keep water down due to nausea and vomiting.  She reports blurred vision and black spots in right eye and tingling on right side of face, arm and leg up to knee.  She has not had MRI/MRA done yet.  REVIEW OF SYSTEMS: Full 14 system review of systems  performed and notable only for:  Constitutional: N/A  Cardiovascular: N/A  Ear/Nose/Throat: N/A  Skin: N/A  Eyes:  Loss of vision (black dots)  Respiratory: N/A  Gastroitestinal: N/A  Genitourinary: N/A Hematology/Lymphatic: N/A  Endocrine: N/A Musculoskeletal:N/A  Allergy/Immunology: N/A  Neurological: headache, numbness Psychiatric: N/A Sleep: insomnia   ALLERGIES: Allergies  Allergen Reactions  . Latex Rash    HOME MEDICATIONS: Outpatient Prescriptions Prior to Visit  Medication Sig Dispense Refill  . DULoxetine (CYMBALTA) 60 MG capsule Take 30-60 mg by mouth 2 (two) times daily.       Marland Kitchen ketorolac (TORADOL) 30 MG/ML injection Inject 60 mg into the muscle every 6 (six) hours as needed for pain.      . pantoprazole (PROTONIX) 40 MG tablet Take 40 mg by mouth daily.       No facility-administered medications prior to visit.   . clonazePAM (KLONOPIN) 0.5 MG tablet    Sig:   . OLANZapine (ZYPREXA) 2.5 MG tablet    Sig:   . mirtazapine (REMERON) 45 MG tablet    Sig:   . promethazine (PHENERGAN) 25 MG tablet    Sig:   . SEROQUEL XR 50 MG TB24 24 hr tablet    Sig:     PAST MEDICAL HISTORY: Past Medical History  Diagnosis Date  . Migraine     hemiplegic  . Migraine     PAST SURGICAL HISTORY: Past Surgical History  Procedure Laterality Date  . Cholecystectomy  2013  . Dental surgery      FAMILY HISTORY: Family History  Problem  Relation Age of Onset  . Cancer - Other Mother 74  . Hypertension Mother   . Hypertension Father   . Breast cancer Maternal Grandmother     SOCIAL HISTORY: History   Social History  . Marital Status: Married    Spouse Name: dan    Number of Children: 3  . Years of Education: college   Occupational History  . N/A    Social History Main Topics  . Smoking status: Former Smoker -- 1.00 packs/day for 5 years  . Smokeless tobacco: Never Used  . Alcohol Use: Yes     Comment: 4/week  . Drug Use: No  . Sexual Activity: No     Other Topics Concern  . Not on file   Social History Narrative  . No narrative on file    PHYSICAL EXAM  Filed Vitals:   09/21/13 1542  BP: 108/72  Pulse: 117  Temp: 98.5 F (36.9 C)  TempSrc: Oral  Height: 5' 5.75" (1.67 m)  Weight: 158 lb (71.668 kg)   Body mass index is 25.7 kg/(m^2).  General: well developed, well nourished, seated, in evident distress due to headache  Head: head normocephalic and atraumatic. Orohparynx benign  Neck: supple with no carotid or supraclavicular bruits  Cardiovascular: regular rate and rhythm, no murmurs  Musculoskeletal: no deformity  Skin: no rash/petichiae  Vascular: Normal pulses all extremities   Neurological examination   Mentation: Alert oriented to time, place, history taking. Follows all commands speech and language fluent Cranial nerve II-XII: Fundoscopic exam reveals sharp disc margins.Pupils were equal round reactive to light extraocular movements were full, visual field were full on confrontational test. Facial sensation and strength were normal. hearing was intact to finger rubbing bilaterally. Uvula tongue midline. head turning and shoulder shrug and were normal and symmetric.Tongue protrusion into cheek strength was normal. Motor: Normal bulk and tone. Normal strength in all tested extremity muscles.  Sensory.: diminished touch, pinprick on right lower face, chest ,upper extremity and right thigh upto knee but with splitting of midline.splits vibration over forehead.  Coordination: Rapid alternating movements normal in all extremities. Finger-to-nose and heel-to-shin performed accurately bilaterally.  Gait and Station: Arises from chair without difficulty. Stance is normal. Gait demonstrates normal stride length and balance . Able to heel, toe and tandem walk without difficulty.  Reflexes: 1+ and symmetric.   DIAGNOSTIC DATA (LABS, IMAGING, TESTING) - I reviewed patient records, labs, notes, testing and imaging myself where  available.   ASSESSMENT AND PLAN 2 year Caucasian lady with long standing refractory migraines with complicated migraine features.   PLAN: Treat acute headache with 60 mg Toradol IM and 25 mg Phenergan IM injection today. (no relief). Decadron 500cc IV infusion in office today. Increase amitriptyline to 50 mg at night for headache prophylaxis. We sent in prescription for a Medrol Dose Pack.  Follow package directions. Take Zanaflex, 2 mg, 1 tablet three times a day as needed for tension headache. Check MRI scan of the brain and MRA of the brain, reschedule sooner. Keep current followup appointment. Keep a strict headache diary and 2 daily next is exercises and participate in activities for stress relaxation.   Meds ordered this encounter  Medications  . amitriptyline (ELAVIL) 25 MG tablet    Sig: Take 2 tablets (50 mg total) by mouth at bedtime.    Dispense:  60 tablet    Refill:  3    Order Specific Question:  Supervising Provider    Answer:  Micki Riley [  [id number]]  . tizanidine (ZANAFLEX) 2 MG capsule    Sig: Take 1 capsule (2 mg total) by mouth 3 (three) times daily as needed (for tension headache).    Dispense:  90 capsule    Refill:  1    Order Specific Question:  Supervising Provider    Answer:  Pearlean Brownie, PRAMOD S [[id number]]  . methylPREDNISolone (MEDROL DOSEPAK) 4 MG tablet    Sig: follow package directions    Dispense:  21 tablet    Refill:  0    Order Specific Question:  Supervising Provider    Answer:  [name] Hammersmith Azure Barrales, MSN, NP-C 09/21/2013, 5:42 PM [facility] [address] [place], [place] [address] [phone]

## 2013-09-22 ENCOUNTER — Encounter (HOSPITAL_COMMUNITY): Payer: Self-pay

## 2013-09-22 ENCOUNTER — Emergency Department (HOSPITAL_COMMUNITY)
Admission: EM | Admit: 2013-09-22 | Discharge: 2013-09-22 | Disposition: A | Discharge status: Home / Self Care | Payer: BC Managed Care – PPO | Attending: Emergency Medicine | Admitting: Emergency Medicine

## 2013-09-22 DIAGNOSIS — G43909 Migraine, unspecified, not intractable, without status migrainosus: Secondary | ICD-10-CM | POA: Insufficient documentation

## 2013-09-22 DIAGNOSIS — Z87891 Personal history of nicotine dependence: Secondary | ICD-10-CM | POA: Insufficient documentation

## 2013-09-22 DIAGNOSIS — Z79899 Other long term (current) drug therapy: Secondary | ICD-10-CM | POA: Insufficient documentation

## 2013-09-22 MED ORDER — HYDROMORPHONE HCL PF 1 MG/ML IJ SOLN
1.0000 mg | Freq: Once | INTRAMUSCULAR | Status: AC
  Administered 2013-09-22: 1 mg via INTRAVENOUS
  Filled 2013-09-22: qty 1

## 2013-09-22 MED ORDER — PROMETHAZINE HCL 25 MG/ML IJ SOLN
25.0000 mg | Freq: Once | INTRAMUSCULAR | Status: AC
  Administered 2013-09-22: 25 mg via INTRAVENOUS
  Filled 2013-09-22: qty 1

## 2013-09-22 MED ORDER — METOCLOPRAMIDE HCL 5 MG/ML IJ SOLN
10.0000 mg | Freq: Once | INTRAMUSCULAR | Status: AC
  Administered 2013-09-22: 10 mg via INTRAVENOUS
  Filled 2013-09-22: qty 2

## 2013-09-22 MED ORDER — KETOROLAC TROMETHAMINE 30 MG/ML IJ SOLN
30.0000 mg | Freq: Once | INTRAMUSCULAR | Status: AC
  Administered 2013-09-22: 30 mg via INTRAVENOUS
  Filled 2013-09-22: qty 1

## 2013-09-22 MED ORDER — DEXAMETHASONE SODIUM PHOSPHATE 10 MG/ML IJ SOLN
10.0000 mg | Freq: Once | INTRAMUSCULAR | Status: AC
  Administered 2013-09-22: 10 mg via INTRAVENOUS
  Filled 2013-09-22: qty 1

## 2013-09-22 NOTE — ED Notes (Signed)
Pt states she has hx of migraine HA and saw neurologist yesterday and was given a shot or toradol and phenergan. Has had the migraine for the past 4 days but it is not getting better. Reports it hurts to move her head and is still nauseated. Sensitivity to light. States the right side of her body is numb and is seeing black dots in her right eye.

## 2013-09-22 NOTE — ED Notes (Signed)
IV attempt x 2 unsuccessful.

## 2013-09-22 NOTE — ED Provider Notes (Signed)
TIME SEEN: 9:01 AM  CHIEF COMPLAINT: Migraine  HPI: Patient is a 27 year old female with a history of complex migraines who presents to the emergency department with a migraine on the left side of her head that is a throbbing, moderate headache without radiation for the past 4 days. She states it feels like her prior migraines it is worse with movement, light, sounds. She has had nausea but no vomiting. She also has right-sided numbness and seeing black dots in her right eye which are typical of her migraines. No history of head injury, fever, neck pain or neck stiffness. She was seen by her neurologist Dr. Pearlean Brownie yesterday and given Toradol and Phenergan without any relief. Of note, this patient's sixth visit to the emergency department in the past 2 months.  ROS: See HPI Constitutional: no fever  Eyes: no drainage  ENT: no runny nose   Cardiovascular:  no chest pain  Resp: no SOB  GI: no vomiting GU: no dysuria Integumentary: no rash  Allergy: no hives  Musculoskeletal: no leg swelling  Neurological: no slurred speech ROS otherwise negative  PAST MEDICAL HISTORY/PAST SURGICAL HISTORY:  Past Medical History  Diagnosis Date  . Migraine     hemiplegic  . Migraine     MEDICATIONS:  Prior to Admission medications   Medication Sig Start Date End Date Taking? Authorizing Provider  amitriptyline (ELAVIL) 25 MG tablet Take 2 tablets (50 mg total) by mouth at bedtime. 09/21/13   Omelia Blackwater, DO  clonazePAM Scarlette Calico) 0.5 MG tablet  09/06/13   Historical Provider, MD  DULoxetine (CYMBALTA) 60 MG capsule Take 30-60 mg by mouth 2 (two) times daily.  06/09/13   Historical Provider, MD  ketorolac (TORADOL) 30 MG/ML injection Inject 60 mg into the muscle every 6 (six) hours as needed for pain.    Historical Provider, MD  ketorolac (TORADOL) 30 MG/ML injection Inject 1 mL (30 mg total) into the muscle once. 09/21/13   Ronal Fear, NP  methylPREDNISolone (MEDROL DOSEPAK) 4 MG tablet follow  package directions 09/21/13   Omelia Blackwater, DO  mirtazapine (REMERON) 45 MG tablet  09/17/13   Historical Provider, MD  OLANZapine (ZYPREXA) 2.5 MG tablet  09/06/13   Historical Provider, MD  pantoprazole (PROTONIX) 40 MG tablet Take 40 mg by mouth daily. 04/04/13   Historical Provider, MD  promethazine (PHENERGAN) 25 MG tablet  09/13/13   Historical Provider, MD  SEROQUEL XR 50 MG TB24 24 hr tablet  09/17/13   Historical Provider, MD  tizanidine (ZANAFLEX) 2 MG capsule Take 1 capsule (2 mg total) by mouth 3 (three) times daily as needed (for tension headache). 09/21/13   Omelia Blackwater, DO    ALLERGIES:  Allergies  Allergen Reactions  . Latex Rash    SOCIAL HISTORY:  History  Substance Use Topics  . Smoking status: Former Smoker -- 1.00 packs/day for 5 years  . Smokeless tobacco: Never Used  . Alcohol Use: Yes     Comment: 4/week    FAMILY HISTORY: Family History  Problem Relation Age of Onset  . Cancer - Other Mother 68  . Hypertension Mother   . Hypertension Father   . Breast cancer Maternal Grandmother     EXAM: LMP 08/24/2013 CONSTITUTIONAL: Alert and oriented and responds appropriately to questions.  Tearful, appears uncomfortable HEAD: Normocephalic EYES: Conjunctivae clear, PERRL ENT: normal nose; no rhinorrhea; moist mucous membranes; pharynx without lesions noted NECK: Supple, no meningismus, no LAD  CARD: Tachycardic; S1 and S2  appreciated; no murmurs, no clicks, no rubs, no gallops RESP: Normal chest excursion without splinting or tachypnea; breath sounds clear and equal bilaterally; no wheezes, no rhonchi, no rales,  ABD/GI: Normal bowel sounds; non-distended; soft, non-tender, no rebound, no guarding BACK:  The back appears normal and is non-tender to palpation, there is no CVA tenderness EXT: Normal ROM in all joints; non-tender to palpation; no edema; normal capillary refill; no cyanosis    SKIN: Normal color for age and race; warm NEURO: Moves all  extremities equally, strength 5/5 in all 4 extremities, no dysmetria to finger-nose testing, cranial nerves II through XII intact, patient reports increased sensation in her right face, right lower extremity and right upper extremity PSYCH: The patient's mood and manner are appropriate. Grooming and personal hygiene are appropriate.  MEDICAL DECISION MAKING: Patient here with her typical complex migraine. She states that narcotics, magnesium, Decadron normally help with her headaches. I had a long discussion with the patient I do not recommend narcotics in the setting and it is likely what is causing her headaches become more frequent, difficult to treat. Have also discussed with patient that she will need to followup with her neurologist she likely needs to be started on prophylactic medications for her migraines. Given patient does appear very uncomfortable, will give medications to control her headache and reassess.  ED PROGRESS: Patient reports some improvement in headache after pain medication given. She still mildly tachycardic. Will continue IV fluids and repeat meds. Discussed with patient that given the chronicity of her headache, I do not feel we will get her completely pain-free in the emergency department and have recommended close outpatient followup with her neurologist. She verbalizes understanding is comfortable with this plan.   12:57 PM  Pt reports improvement her headache. She feels she can control her symptoms at home. He reports that she would like to be discharged home so she can sleep. Of note, patient is still mildly tachycardic with a heart rate in the 1 teens. She denies any chest pain or shortness of breath. She's been given 1 L of IV fluids in the ED. She states that her heart rate normally is in the 90s to 100s. Have asked patient to stay in the emergency department 2 weeks and further control her tachycardia she states she would like to be discharged at this time. We'll  discharge home with return precautions, neurology followup. Patient verbalizes understanding is comfortable with plan.  Layla Maw Carmell Elgin, DO 09/22/13 1309

## 2013-09-22 NOTE — ED Notes (Signed)
Given some apple juice

## 2013-09-22 NOTE — ED Notes (Signed)
IV team notified of difficult IV start and pt has been stuck 4 x by ED staff-- will come and start IV

## 2013-09-22 NOTE — ED Notes (Signed)
Placed on cardiac monitor for HR 128

## 2013-09-24 ENCOUNTER — Emergency Department: Payer: Self-pay | Admitting: Emergency Medicine

## 2013-09-27 ENCOUNTER — Encounter (HOSPITAL_COMMUNITY): Payer: Self-pay | Admitting: *Deleted

## 2013-09-27 ENCOUNTER — Emergency Department (HOSPITAL_COMMUNITY)
Admission: EM | Admit: 2013-09-27 | Discharge: 2013-09-27 | Disposition: A | Discharge status: Home / Self Care | Payer: BC Managed Care – PPO | Attending: Emergency Medicine | Admitting: Emergency Medicine

## 2013-09-27 DIAGNOSIS — Z87891 Personal history of nicotine dependence: Secondary | ICD-10-CM | POA: Insufficient documentation

## 2013-09-27 DIAGNOSIS — Z91040 Latex allergy status: Secondary | ICD-10-CM | POA: Insufficient documentation

## 2013-09-27 DIAGNOSIS — R112 Nausea with vomiting, unspecified: Secondary | ICD-10-CM | POA: Insufficient documentation

## 2013-09-27 DIAGNOSIS — G43409 Hemiplegic migraine, not intractable, without status migrainosus: Secondary | ICD-10-CM | POA: Insufficient documentation

## 2013-09-27 DIAGNOSIS — Z79899 Other long term (current) drug therapy: Secondary | ICD-10-CM | POA: Insufficient documentation

## 2013-09-27 DIAGNOSIS — Z3202 Encounter for pregnancy test, result negative: Secondary | ICD-10-CM | POA: Insufficient documentation

## 2013-09-27 DIAGNOSIS — H53149 Visual discomfort, unspecified: Secondary | ICD-10-CM | POA: Insufficient documentation

## 2013-09-27 DIAGNOSIS — R209 Unspecified disturbances of skin sensation: Secondary | ICD-10-CM | POA: Insufficient documentation

## 2013-09-27 LAB — POCT PREGNANCY, URINE: Preg Test, Ur: NEGATIVE

## 2013-09-27 MED ORDER — DIPHENHYDRAMINE HCL 50 MG/ML IJ SOLN
25.0000 mg | Freq: Once | INTRAMUSCULAR | Status: AC
  Administered 2013-09-27: 50 mg via INTRAVENOUS
  Filled 2013-09-27: qty 1

## 2013-09-27 MED ORDER — SODIUM CHLORIDE 0.9 % IV BOLUS (SEPSIS)
1000.0000 mL | Freq: Once | INTRAVENOUS | Status: AC
  Administered 2013-09-27: 1000 mL via INTRAVENOUS

## 2013-09-27 MED ORDER — METOCLOPRAMIDE HCL 5 MG/ML IJ SOLN
10.0000 mg | Freq: Once | INTRAMUSCULAR | Status: AC
  Administered 2013-09-27: 10 mg via INTRAVENOUS
  Filled 2013-09-27: qty 2

## 2013-09-27 MED ORDER — DEXAMETHASONE SODIUM PHOSPHATE 10 MG/ML IJ SOLN
10.0000 mg | Freq: Once | INTRAMUSCULAR | Status: AC
  Administered 2013-09-27: 10 mg via INTRAVENOUS
  Filled 2013-09-27: qty 1

## 2013-09-27 MED ORDER — HYDROMORPHONE HCL PF 1 MG/ML IJ SOLN
1.0000 mg | Freq: Once | INTRAMUSCULAR | Status: AC
  Administered 2013-09-27: 1 mg via INTRAVENOUS
  Filled 2013-09-27: qty 1

## 2013-09-27 MED ORDER — ONDANSETRON HCL 4 MG/2ML IJ SOLN
4.0000 mg | Freq: Once | INTRAMUSCULAR | Status: AC
  Administered 2013-09-27: 4 mg via INTRAVENOUS
  Filled 2013-09-27: qty 2

## 2013-09-27 MED ORDER — PROMETHAZINE HCL 25 MG PO TABS
25.0000 mg | ORAL_TABLET | Freq: Once | ORAL | Status: AC
  Administered 2013-09-27: 25 mg via ORAL
  Filled 2013-09-27: qty 1

## 2013-09-27 MED ORDER — VALPROATE SODIUM 500 MG/5ML IV SOLN
1.0000 g | Freq: Once | INTRAVENOUS | Status: AC
  Administered 2013-09-27: 1000 mg via INTRAVENOUS
  Filled 2013-09-27 (×2): qty 10

## 2013-09-27 NOTE — ED Notes (Signed)
Pt states since Saturday she has had a migraine with pain left side of head, but on cannot see out of right eye and looks black.  Pt reports numb from under right eye down to right knee.  Pt states this has happened with her migraines.  Pt reports vomiting.

## 2013-09-27 NOTE — ED Provider Notes (Signed)
CSN: [id number]     Arrival date & time 09/27/13  1104 History   First MD Initiated Contact with Patient 09/27/13 1120     Chief Complaint  Patient presents with  . Migraine  . Emesis   (Consider location/radiation/quality/duration/timing/severity/associated sxs/prior Treatment) Patient is a 27 y.o. female presenting with migraines and vomiting. The history is provided by the patient. No language interpreter was used.  Migraine This is a recurrent problem. The current episode started more than 2 days ago. The problem occurs constantly. The problem has been gradually worsening. Associated symptoms include headaches. Pertinent negatives include no chest pain, no abdominal pain and no shortness of breath. Associated symptoms comments: R sided paresthesias of whole body, and decreased vision off R eye described as black dot across vision.  Both similar to prior migraines. . Exacerbated by: light. The symptoms are relieved by rest (dark room). Treatments tried: toradol, klonopin, zanaflex. The treatment provided no relief.  Emesis Associated symptoms: headaches   Associated symptoms: no abdominal pain, no arthralgias, no chills, no diarrhea and no sore throat     Past Medical History  Diagnosis Date  . Migraine     hemiplegic  . Migraine    Past Surgical History  Procedure Laterality Date  . Cholecystectomy  2013  . Dental surgery     Family History  Problem Relation Age of Onset  . Cancer - Other Mother 62  . Hypertension Mother   . Hypertension Father   . Breast cancer Maternal Grandmother    History  Substance Use Topics  . Smoking status: Former Smoker -- 1.00 packs/day for 5 years  . Smokeless tobacco: Never Used  . Alcohol Use: Yes     Comment: 4/week   OB History   Grav Para Term Preterm Abortions TAB SAB Ect Mult Living   3         3     Obstetric Comments   Age first pregnancy 18     Review of Systems  Constitutional: Negative for fever, chills, diaphoresis,  activity change, appetite change and fatigue.  HENT: Negative for congestion, sore throat, facial swelling, rhinorrhea, neck pain and neck stiffness.   Eyes: Positive for photophobia and visual disturbance. Negative for discharge.  Respiratory: Negative for cough, chest tightness and shortness of breath.   Cardiovascular: Negative for chest pain, palpitations and leg swelling.  Gastrointestinal: Positive for nausea and vomiting. Negative for abdominal pain and diarrhea.  Endocrine: Negative for polydipsia and polyuria.  Genitourinary: Negative for dysuria, frequency, difficulty urinating and pelvic pain.  Musculoskeletal: Negative for back pain and arthralgias.  Skin: Negative for color change and wound.  Allergic/Immunologic: Negative for immunocompromised state.  Neurological: Positive for numbness and headaches. Negative for seizures, facial asymmetry, speech difficulty, weakness and light-headedness.  Hematological: Does not bruise/bleed easily.  Psychiatric/Behavioral: Negative for confusion and agitation.    Allergies  Latex  Home Medications   Current Outpatient Rx  Name  Route  Sig  Dispense  Refill  . amitriptyline (ELAVIL) 25 MG tablet   Oral   Take 2 tablets (50 mg total) by mouth at bedtime.   60 tablet   3   . clonazePAM (KLONOPIN) 0.5 MG tablet   Oral   Take 0.5 mg by mouth 2 (two) times daily as needed for anxiety.          . DULoxetine (CYMBALTA) 30 MG capsule   Oral   Take 30-60 mg by mouth daily. Takes 60mg  every morning and takes  30mg  every night at bedtime.         Marland Kitchen ketorolac (TORADOL) 30 MG/ML injection   Intramuscular   Inject 60 mg into the muscle every 6 (six) hours as needed for pain.         Marland Kitchen OLANZapine (ZYPREXA) 2.5 MG tablet   Oral   Take 2.5 mg by mouth at bedtime.          . pantoprazole (PROTONIX) 40 MG tablet   Oral   Take 40 mg by mouth daily.         . SEROQUEL XR 50 MG TB24 24 hr tablet   Oral   Take 50 mg by mouth  daily as needed (sleep).          . mirtazapine (REMERON) 45 MG tablet   Oral   Take 45 mg by mouth at bedtime.          . tizanidine (ZANAFLEX) 2 MG capsule   Oral   Take 1 capsule (2 mg total) by mouth 3 (three) times daily as needed (for tension headache).   90 capsule   1    BP 110/63  Pulse 98  Temp(Src) 98.3 F (36.8 C) (Oral)  Resp 16  SpO2 96%  LMP 08/24/2013 Physical Exam  Constitutional: She is oriented to person, place, and time. She appears well-developed and well-nourished. No distress.  HENT:  Head: Normocephalic and atraumatic.  Mouth/Throat: No oropharyngeal exudate.  Eyes: Pupils are equal, round, and reactive to light.  Neck: Normal range of motion. Neck supple.  Cardiovascular: Normal rate, regular rhythm and normal heart sounds.  Exam reveals no gallop and no friction rub.   No murmur heard. Pulmonary/Chest: Effort normal and breath sounds normal. No respiratory distress. She has no wheezes. She has no rales.  Abdominal: Soft. Bowel sounds are normal. She exhibits no distension and no mass. There is no tenderness. There is no rebound and no guarding.  Musculoskeletal: Normal range of motion. She exhibits no edema and no tenderness.  Neurological: She is alert and oriented to person, place, and time. She has normal strength. She displays no tremor. A sensory deficit is present. No cranial nerve deficit. She exhibits normal muscle tone. Coordination and gait normal. GCS eye subscore is 4. GCS verbal subscore is 5. GCS motor subscore is 6.  Reports paresthesias of whole R side of body  Skin: Skin is warm and dry.  Psychiatric: She has a normal mood and affect.    ED Course  Procedures (including critical care time) Labs Review Labs Reviewed  POCT PREGNANCY, URINE   Imaging Review No results found.  MDM   1. Migraine, hemiplegic    Pt is a 27 y.o. female with Pmhx as above who presents with 3 days of migraine h/a, with assoc n/v, L sided h/a, R  sided blurry vision and paresthesias, all similar to prior.  H/a, not improved by home meds.  Denies fever, chills, cough, CP, Ab pain, difficulty ambulating.  Neuro exam as above, which pt states is similar to prior migraine symptoms.  Migraine cocktail ordered.  12:33PM Not improved w/ migraine cocktail, requesting IV dilaudid/phenergan.  Will try Depakote as narcotics not first line migraine treatment.   2:43 PM Not improved w/ Depakote, giving 2nd dose IV dilaudid, pt reports she would like to be discharged afterwards.  Pt has had multiple ED visits in past 2 months for similar symptoms.  Will recommend she f/u with her neurologist through [facility]. Given the chronicity,  I doubt CVA/TIA and do not feel advanced imaging would be helpful.  Also doubt SAH, meningitis.  Return precautions given for new or worsening symptoms.   1. Migraine, hemiplegic         Shanna Cisco, MD 09/27/13 1727

## 2013-09-27 NOTE — ED Notes (Addendum)
Pt reports migraine HA since Saturday with right sided numbness and left sided pain. States that this is normal for her. She is AO x 4. Neuro intact, but complains to right sided numbness from head to knee. Pt has photophobia, with "black vision in my right eye. It looks like a black dot." Ambulates with no noted difficulty. Pt also reports emesis x Saturday with continued nausea. Speech clear.

## 2013-09-27 NOTE — ED Notes (Signed)
Pt requesting phenergan and dilaudid for pain. MD made aware.

## 2013-09-27 NOTE — ED Notes (Signed)
Awaiting medications from pharmacy. Pt made aware. Pt sitting in bed, resting comfortably.

## 2013-09-29 ENCOUNTER — Emergency Department: Payer: Self-pay | Admitting: Internal Medicine

## 2013-09-29 LAB — URINALYSIS, COMPLETE
Bilirubin,UR: NEGATIVE
Blood: NEGATIVE
Leukocyte Esterase: NEGATIVE
Nitrite: NEGATIVE
Ph: 7 (ref 4.5–8.0)
RBC,UR: <1 /HPF (ref 0–5)
Specific Gravity: 1.015 (ref 1.003–1.030)
Squamous Epithelial: 2
WBC UR: 1 /HPF (ref 0–5)

## 2013-10-03 ENCOUNTER — Encounter (HOSPITAL_COMMUNITY): Payer: Self-pay | Admitting: Emergency Medicine

## 2013-10-03 ENCOUNTER — Emergency Department (HOSPITAL_COMMUNITY)
Admission: EM | Admit: 2013-10-03 | Discharge: 2013-10-03 | Disposition: A | Discharge status: Home / Self Care | Payer: BC Managed Care – PPO | Attending: Emergency Medicine | Admitting: Emergency Medicine

## 2013-10-03 DIAGNOSIS — G43409 Hemiplegic migraine, not intractable, without status migrainosus: Secondary | ICD-10-CM | POA: Insufficient documentation

## 2013-10-03 DIAGNOSIS — Z87891 Personal history of nicotine dependence: Secondary | ICD-10-CM | POA: Insufficient documentation

## 2013-10-03 DIAGNOSIS — Z91040 Latex allergy status: Secondary | ICD-10-CM | POA: Insufficient documentation

## 2013-10-03 DIAGNOSIS — Z79899 Other long term (current) drug therapy: Secondary | ICD-10-CM | POA: Insufficient documentation

## 2013-10-03 DIAGNOSIS — G43909 Migraine, unspecified, not intractable, without status migrainosus: Secondary | ICD-10-CM

## 2013-10-03 MED ORDER — HYDROMORPHONE HCL PF 1 MG/ML IJ SOLN
1.0000 mg | Freq: Once | INTRAMUSCULAR | Status: AC
  Administered 2013-10-03: 1 mg via INTRAVENOUS
  Filled 2013-10-03: qty 1

## 2013-10-03 MED ORDER — SODIUM CHLORIDE 0.9 % IV BOLUS (SEPSIS)
1000.0000 mL | INTRAVENOUS | Status: AC
  Administered 2013-10-03: 1000 mL via INTRAVENOUS

## 2013-10-03 MED ORDER — METOCLOPRAMIDE HCL 5 MG/ML IJ SOLN
10.0000 mg | Freq: Once | INTRAMUSCULAR | Status: AC
  Administered 2013-10-03: 10 mg via INTRAVENOUS
  Filled 2013-10-03: qty 2

## 2013-10-03 MED ORDER — DEXAMETHASONE SODIUM PHOSPHATE 10 MG/ML IJ SOLN
10.0000 mg | Freq: Once | INTRAMUSCULAR | Status: AC
  Administered 2013-10-03: 10 mg via INTRAVENOUS
  Filled 2013-10-03: qty 1

## 2013-10-03 MED ORDER — PROMETHAZINE HCL 25 MG/ML IJ SOLN
25.0000 mg | Freq: Once | INTRAMUSCULAR | Status: AC
  Administered 2013-10-03: 25 mg via INTRAVENOUS
  Filled 2013-10-03: qty 1

## 2013-10-03 MED ORDER — VALPROATE SODIUM 500 MG/5ML IV SOLN
500.0000 mg | Freq: Once | INTRAVENOUS | Status: AC
  Administered 2013-10-03: 500 mg via INTRAVENOUS
  Filled 2013-10-03: qty 5

## 2013-10-03 MED ORDER — DIPHENHYDRAMINE HCL 50 MG/ML IJ SOLN
25.0000 mg | Freq: Once | INTRAMUSCULAR | Status: AC
  Administered 2013-10-03: 25 mg via INTRAVENOUS
  Filled 2013-10-03: qty 1

## 2013-10-03 MED ORDER — PROMETHAZINE HCL 25 MG/ML IJ SOLN
12.5000 mg | Freq: Once | INTRAMUSCULAR | Status: AC
  Administered 2013-10-03: 12.5 mg via INTRAVENOUS
  Filled 2013-10-03: qty 1

## 2013-10-03 MED ORDER — HALOPERIDOL LACTATE 5 MG/ML IJ SOLN
2.0000 mg | Freq: Once | INTRAMUSCULAR | Status: AC
  Administered 2013-10-03: 2 mg via INTRAVENOUS
  Filled 2013-10-03: qty 1

## 2013-10-03 NOTE — ED Notes (Signed)
Pt reporting h/a, nausea remains. EDP made aware and to bedside to assess patient.

## 2013-10-03 NOTE — ED Provider Notes (Signed)
CSN: [id number]     Arrival date & time 10/03/13  1044 History   First MD Initiated Contact with Patient 10/03/13 1056     Chief Complaint  Patient presents with  . Migraine   (Consider location/radiation/quality/duration/timing/severity/associated sxs/prior Treatment) Patient is a 27 y.o. female presenting with migraines. The history is provided by the patient.  Migraine This is a chronic problem. The current episode started 12 to 24 hours ago. The problem occurs constantly. The problem has not changed since onset.Pertinent negatives include no chest pain, no abdominal pain, no headaches and no shortness of breath. Exacerbated by: bright light. Nothing relieves the symptoms. She has tried nothing for the symptoms. The treatment provided no relief.    Past Medical History  Diagnosis Date  . Migraine     hemiplegic  . Migraine    Past Surgical History  Procedure Laterality Date  . Cholecystectomy  2013  . Dental surgery     Family History  Problem Relation Age of Onset  . Cancer - Other Mother 44  . Hypertension Mother   . Hypertension Father   . Breast cancer Maternal Grandmother    History  Substance Use Topics  . Smoking status: Former Smoker -- 1.00 packs/day for 5 years  . Smokeless tobacco: Never Used  . Alcohol Use: Yes     Comment: 4/week   OB History   Grav Para Term Preterm Abortions TAB SAB Ect Mult Living   3         3     Obstetric Comments   Age first pregnancy 18     Review of Systems  Constitutional: Negative for fever and fatigue.  HENT: Negative for congestion, drooling and neck pain.   Eyes: Negative for pain.  Respiratory: Negative for cough and shortness of breath.   Cardiovascular: Negative for chest pain.  Gastrointestinal: Negative for nausea, vomiting, abdominal pain and diarrhea.  Genitourinary: Negative for dysuria and hematuria.  Musculoskeletal: Negative for back pain and gait problem.  Skin: Negative for color change.  Neurological:  Negative for dizziness and headaches.  Hematological: Negative for adenopathy.  Psychiatric/Behavioral: Negative for behavioral problems.  All other systems reviewed and are negative.    Allergies  Latex  Home Medications   Current Outpatient Rx  Name  Route  Sig  Dispense  Refill  . amitriptyline (ELAVIL) 25 MG tablet   Oral   Take 2 tablets (50 mg total) by mouth at bedtime.   60 tablet   3   . clonazePAM (KLONOPIN) 0.5 MG tablet   Oral   Take 0.5 mg by mouth 2 (two) times daily as needed for anxiety.          . DULoxetine (CYMBALTA) 30 MG capsule   Oral   Take 30-60 mg by mouth daily. Takes 60mg  every morning and takes 30mg  every night at bedtime.         Marland Kitchen ketorolac (TORADOL) 30 MG/ML injection   Intramuscular   Inject 60 mg into the muscle every 6 (six) hours as needed for pain.         . mirtazapine (REMERON) 45 MG tablet   Oral   Take 45 mg by mouth at bedtime.          Marland Kitchen OLANZapine (ZYPREXA) 2.5 MG tablet   Oral   Take 2.5 mg by mouth at bedtime.          . pantoprazole (PROTONIX) 40 MG tablet   Oral   Take 40  mg by mouth daily.         . SEROQUEL XR 50 MG TB24 24 hr tablet   Oral   Take 50 mg by mouth daily as needed (sleep).          . tizanidine (ZANAFLEX) 2 MG capsule   Oral   Take 1 capsule (2 mg total) by mouth 3 (three) times daily as needed (for tension headache).   90 capsule   1    BP 100/70  Pulse 108  Temp(Src) 98.2 F (36.8 C) (Oral)  Wt 140 lb (63.504 kg)  BMI 26.47 kg/m2  SpO2 98%  LMP 09/18/2013 Physical Exam  Nursing note and vitals reviewed. Constitutional: She is oriented to person, place, and time. She appears well-developed and well-nourished.  HENT:  Head: Normocephalic.  Mouth/Throat: Oropharynx is clear and moist. No oropharyngeal exudate.  Eyes: Conjunctivae and EOM are normal. Pupils are equal, round, and reactive to light.  Neck: Normal range of motion. Neck supple.  Cardiovascular: Normal rate,  regular rhythm, normal heart sounds and intact distal pulses.  Exam reveals no gallop and no friction rub.   No murmur heard. Pulmonary/Chest: Effort normal and breath sounds normal. No respiratory distress. She has no wheezes.  Abdominal: Soft. Bowel sounds are normal. There is no tenderness. There is no rebound and no guarding.  Musculoskeletal: Normal range of motion. She exhibits no edema and no tenderness.  Neurological: She is alert and oriented to person, place, and time. She has normal strength. No cranial nerve deficit or sensory deficit.  Altered sensation to touch on V2, V3 of the right side of the face, the right arm, and the right leg to just above the right knee.   Normal finger to nose bilaterally.   Skin: Skin is warm and dry.  Psychiatric: She has a normal mood and affect. Her behavior is normal.    ED Course  Procedures (including critical care time) Labs Review Labs Reviewed  PREGNANCY, URINE   Imaging Review No results found.  MDM   1. Migraine    11:23 AM 27 y.o. female with a history of migraines and many visits here previously for migraines presents with a gradual onset migraine which began yesterday evening. The patient notes that it is consistent with previous migraines. She has some accompanying symptoms which include numbness and tingling below her right thigh extending to her right knee. She notes photophobia as well. He states the headache is throbbing and she is tearful on exam. The patient notes that she had a headache cocktail during a previous visit which consisted of the lauded, Depakote, and Phenergan and she states that this helped resolve her headache. Will treat her with this regimen.  Pt refused pregnancy test.   3:04 PM: Pt feeling better after several rounds of meds.  I have discussed the diagnosis/risks/treatment options with the patient and believe the pt to be eligible for discharge home to follow-up with neurologist as needed. . We also  discussed returning to the ED immediately if new or worsening sx occur. We discussed the sx which are most concerning (e.g., worsening numbness/tingling, ) that necessitate immediate return. Any new prescriptions provided to the patient are listed below.  New Prescriptions   No medications on file     Junius Argyle, MD 10/03/13 1558

## 2013-10-03 NOTE — ED Notes (Signed)
Pt is refusing urine pregnancy b/c she states she is not pregnant and that it will be a waste.

## 2013-10-03 NOTE — ED Notes (Signed)
Pt. Stated, I've also been throwing up.

## 2013-10-03 NOTE — ED Notes (Signed)
Called pharmacy for Phenergan. Stated it was sent.

## 2013-10-03 NOTE — ED Notes (Signed)
Pt. Stated, I started having a headache last night, its a migraine.

## 2013-10-06 ENCOUNTER — Emergency Department (HOSPITAL_COMMUNITY)
Admission: EM | Admit: 2013-10-06 | Discharge: 2013-10-06 | Disposition: A | Discharge status: Home / Self Care | Payer: BC Managed Care – PPO | Attending: Emergency Medicine | Admitting: Emergency Medicine

## 2013-10-06 ENCOUNTER — Emergency Department: Payer: Self-pay | Admitting: Internal Medicine

## 2013-10-06 ENCOUNTER — Emergency Department (HOSPITAL_COMMUNITY): Payer: BC Managed Care – PPO

## 2013-10-06 ENCOUNTER — Encounter (HOSPITAL_COMMUNITY): Payer: Self-pay | Admitting: Emergency Medicine

## 2013-10-06 DIAGNOSIS — Z79899 Other long term (current) drug therapy: Secondary | ICD-10-CM | POA: Insufficient documentation

## 2013-10-06 DIAGNOSIS — Z87891 Personal history of nicotine dependence: Secondary | ICD-10-CM | POA: Insufficient documentation

## 2013-10-06 DIAGNOSIS — G43909 Migraine, unspecified, not intractable, without status migrainosus: Secondary | ICD-10-CM | POA: Insufficient documentation

## 2013-10-06 DIAGNOSIS — Z91040 Latex allergy status: Secondary | ICD-10-CM | POA: Insufficient documentation

## 2013-10-06 MED ORDER — VALPROATE SODIUM 500 MG/5ML IV SOLN
500.0000 mg | Freq: Once | INTRAVENOUS | Status: AC
  Administered 2013-10-06: 500 mg via INTRAVENOUS
  Filled 2013-10-06: qty 5

## 2013-10-06 MED ORDER — PROMETHAZINE HCL 25 MG/ML IJ SOLN
25.0000 mg | Freq: Once | INTRAMUSCULAR | Status: AC
  Administered 2013-10-06: 25 mg via INTRAMUSCULAR
  Filled 2013-10-06: qty 1

## 2013-10-06 MED ORDER — HYDROMORPHONE HCL PF 2 MG/ML IJ SOLN
2.0000 mg | Freq: Once | INTRAMUSCULAR | Status: AC
  Administered 2013-10-06: 2 mg via INTRAVENOUS
  Filled 2013-10-06: qty 1

## 2013-10-06 MED ORDER — HYDROMORPHONE HCL PF 1 MG/ML IJ SOLN
1.0000 mg | Freq: Once | INTRAMUSCULAR | Status: AC
  Administered 2013-10-06: 1 mg via INTRAVENOUS
  Filled 2013-10-06: qty 1

## 2013-10-06 MED ORDER — GADOBENATE DIMEGLUMINE 529 MG/ML IV SOLN
15.0000 mL | Freq: Once | INTRAVENOUS | Status: AC
  Administered 2013-10-06: 15 mL via INTRAVENOUS

## 2013-10-06 NOTE — ED Notes (Signed)
MRI aware of PT

## 2013-10-06 NOTE — ED Notes (Addendum)
PT endorses normal migraine presentation. Right sided visual blurred. Right sided arm numbness Sx onset today at 1000

## 2013-10-06 NOTE — ED Notes (Signed)
Pt c/o frontal right sided HA with pain to right side of face; pt sts some blurry vision and numbness to right side x 2 days; pt sts N/V which are sx of typical migraine

## 2013-10-06 NOTE — ED Notes (Signed)
Patient transported to MRI 

## 2013-10-06 NOTE — ED Notes (Signed)
Family at bedside. PT mother

## 2013-10-06 NOTE — ED Provider Notes (Signed)
CSN: [id number]     Arrival date & time 10/06/13  1313 History   First MD Initiated Contact with Patient 10/06/13 1405     Chief Complaint  Patient presents with  . Migraine   (Consider location/radiation/quality/duration/timing/severity/associated sxs/prior Treatment) HPI  [name] is a 27 year old female with a past medical history of chronic intractable migraines that are only relieved by her more phone, Phenergan and Depakote.  The patient has been seen at the emergency department 9 times since the beginning of last month.  I know this patient personally and have treated her here with the same.  She has a past medical history of migraines with hemiparesis and hemiplegic symptoms.  She presents today with chief complaint of the same.  She denies any difference in her headache today.  She has been taking IM Toradol at home without relief.  Patient has been followed by neurology in [facility] for many years and has been tried on multiple prophylactic medications, Botox injections without relief of her symptoms.  Patient was recently seen at [facility] for her headache.  She is followed by Dr. Pearlean Brownie.  Patient was last seen by nurse practitioner LAM.  I spoke with Ms. Lam about the patient.  I comes here very frequently and I feel comfortable giving her narcotics as this is not the normal treatment for her migraine headaches.  In particular for me that they do not prescribe any narcotic type pain medications at neurologist and that she is likely coming to the emergency department because he only relief of her symptoms.  She also states that they have ordered MRI/MRA the patient has not yet followed up for that.  She states that her headache begins in the left occiput and radiates to the left thigh and across the frontal bone.  She complains of right sided hemi-paresthesia.  Denies any weakness.  She has associated photophobia and nausea per her normal migraine. Denies visual  changes, stiff neck, neck pain, rash, or "thunderclap" onset.    Past Medical History  Diagnosis Date  . Migraine     hemiplegic  . Migraine    Past Surgical History  Procedure Laterality Date  . Cholecystectomy  2013  . Dental surgery     Family History  Problem Relation Age of Onset  . Cancer - Other Mother 73  . Hypertension Mother   . Hypertension Father   . Breast cancer Maternal Grandmother    History  Substance Use Topics  . Smoking status: Former Smoker -- 1.00 packs/day for 5 years  . Smokeless tobacco: Never Used  . Alcohol Use: Yes     Comment: 4/week   OB History   Grav Para Term Preterm Abortions TAB SAB Ect Mult Living   3         3     Obstetric Comments   Age first pregnancy 18     Review of Systems Ten systems reviewed and are negative for acute change, except as noted in the HPI.   Allergies  Latex  Home Medications   Current Outpatient Rx  Name  Route  Sig  Dispense  Refill  . amitriptyline (ELAVIL) 25 MG tablet   Oral   Take 2 tablets (50 mg total) by mouth at bedtime.   60 tablet   3   . clonazePAM (KLONOPIN) 0.5 MG tablet   Oral   Take 0.5 mg by mouth 2 (two) times daily as needed for anxiety.          Marland Kitchen  DULoxetine (CYMBALTA) 30 MG capsule   Oral   Take 30-60 mg by mouth 2 (two) times daily. Takes 60mg  every morning and takes 30mg  every night at bedtime.         Marland Kitchen ketorolac (TORADOL) 30 MG/ML injection   Intramuscular   Inject 60 mg into the muscle every 6 (six) hours as needed for pain.         . mirtazapine (REMERON) 45 MG tablet   Oral   Take 45 mg by mouth at bedtime.          Marland Kitchen OLANZapine (ZYPREXA) 2.5 MG tablet   Oral   Take 2.5 mg by mouth at bedtime.          . pantoprazole (PROTONIX) 40 MG tablet   Oral   Take 40 mg by mouth daily.         . SEROQUEL XR 50 MG TB24 24 hr tablet   Oral   Take 50 mg by mouth daily as needed (sleep).          . tizanidine (ZANAFLEX) 2 MG capsule   Oral   Take 1  capsule (2 mg total) by mouth 3 (three) times daily as needed (for tension headache).   90 capsule   1    BP 117/81  Pulse 115  Temp(Src) 98.3 F (36.8 C) (Oral)  Resp 18  Ht 5\' 4"  (1.626 m)  Wt 158 lb (71.668 kg)  BMI 27.11 kg/m2  SpO2 96%  LMP 09/18/2013 Physical Exam Physical Exam  Nursing note and vitals reviewed. Constitutional: She is oriented to person, place, and time. She appears well-developed and well-nourished. No distress.  HENT:  Head: Normocephalic and atraumatic.  Eyes: Conjunctivae normal and EOM are normal. Pupils are equal, round, and reactive to light. No scleral icterus.  Neck: Normal range of motion.  Cardiovascular: Normal rate, regular rhythm and normal heart sounds.  Exam reveals no gallop and no friction rub.   No murmur heard. Pulmonary/Chest: Effort normal and breath sounds normal. No respiratory distress.  Abdominal: Soft. Bowel sounds are normal. She exhibits no distension and no mass. There is no tenderness. There is no guarding.  Neurological: She is alert and oriented to person, place, and time.  Speech is clear and goal oriented, follows commands Major Cranial nerves without deficit, no facial droop Normal strength in upper and lower extremities bilaterally including dorsiflexion and plantar flexion, strong and equal grip strength Sensation normal to light and sharp touch Moves extremities without ataxia, coordination intact Normal finger to nose and rapid alternating movements Neg romberg, no pronator drift Normal gait Normal heel-shin and balance Skin: Skin is warm and dry. She is not diaphoretic.     ED Course  Procedures (including critical care time) Labs Review Labs Reviewed - No data to display Imaging Review No results found.  MDM   1. Migraine     patient given the medication she requests which are dialogue it, Depakote, and Phenergan.  Although I do not wish she generally treat patients with narcotics.  I did try giving  the patient normal migraine cocktail at her last visit.  The patient became extremely difficult to work with and ended up getting the medications she states treat her headache.  Patient MRI/MRA are pending.    6:08 PM Filed Vitals:   10/06/13 1630  BP: 117/75  Pulse: 98  Temp: 98.8 F (37.1 C)  Resp: 16   Patient has received 4 mg IV dialogue it, 20 mg  of Phenergan,  500 mg IV in Depakote.  While at MRI the patient began rolling around and retching and had to return.  She is requesting more Phenergan.  She states that these medications are not helping her headache.      Patient MRI/MRA negative for abnormality. Patient given 5 mg dilaudid. Headache is intractable but has had some relief. No vomiting. Pt HA treated and improved while in ED.  Presentation is like pts typical HA and non concerning for [facility], ICH, Meningitis, or temporal arteritis. Pt is afebrile with no focal neuro deficits, nuchal rigidity, or change in vision. Pt is to follow up with PCP to discuss prophylactic medication. Pt verbalizes understanding and is agreeable with plan to dc.       Arthor Captain, PA-C 10/08/13 [phone]

## 2013-10-06 NOTE — ED Notes (Signed)
Call from MRI, PT "wretching and writhing from side to side". MRI tech would not run scan at this time. PT returned to ED. EDP notified

## 2013-10-08 ENCOUNTER — Emergency Department: Payer: Self-pay | Admitting: Internal Medicine

## 2013-10-11 NOTE — ED Provider Notes (Signed)
Medical screening examination/treatment/procedure(s) were performed by non-physician practitioner and as supervising physician I was immediately available for consultation/collaboration.   Audree Camel, MD 10/11/13 [phone]

## 2013-10-13 ENCOUNTER — Encounter (HOSPITAL_COMMUNITY): Payer: Self-pay | Admitting: Emergency Medicine

## 2013-10-13 ENCOUNTER — Emergency Department (HOSPITAL_COMMUNITY)
Admission: EM | Admit: 2013-10-13 | Discharge: 2013-10-13 | Disposition: A | Discharge status: Home / Self Care | Payer: BC Managed Care – PPO | Attending: Emergency Medicine | Admitting: Emergency Medicine

## 2013-10-13 DIAGNOSIS — G43909 Migraine, unspecified, not intractable, without status migrainosus: Secondary | ICD-10-CM

## 2013-10-13 DIAGNOSIS — Z79899 Other long term (current) drug therapy: Secondary | ICD-10-CM | POA: Insufficient documentation

## 2013-10-13 DIAGNOSIS — Z87891 Personal history of nicotine dependence: Secondary | ICD-10-CM | POA: Insufficient documentation

## 2013-10-13 DIAGNOSIS — Z91040 Latex allergy status: Secondary | ICD-10-CM | POA: Insufficient documentation

## 2013-10-13 MED ORDER — PROMETHAZINE HCL 25 MG RE SUPP: 25.0000 mg | Freq: Four times a day (QID) | RECTAL | Status: DC | PRN

## 2013-10-13 MED ORDER — MORPHINE SULFATE 4 MG/ML IJ SOLN
4.0000 mg | Freq: Once | INTRAMUSCULAR | Status: AC
  Administered 2013-10-13: 4 mg via INTRAVENOUS
  Filled 2013-10-13: qty 1

## 2013-10-13 MED ORDER — SODIUM CHLORIDE 0.9 % IV BOLUS (SEPSIS)
1000.0000 mL | Freq: Once | INTRAVENOUS | Status: AC
  Administered 2013-10-13: 1000 mL via INTRAVENOUS

## 2013-10-13 MED ORDER — KETOROLAC TROMETHAMINE 30 MG/ML IJ SOLN
30.0000 mg | Freq: Once | INTRAMUSCULAR | Status: AC
  Administered 2013-10-13: 30 mg via INTRAVENOUS
  Filled 2013-10-13: qty 1

## 2013-10-13 MED ORDER — METOCLOPRAMIDE HCL 5 MG/ML IJ SOLN
10.0000 mg | Freq: Once | INTRAMUSCULAR | Status: AC
  Administered 2013-10-13: 10 mg via INTRAVENOUS
  Filled 2013-10-13: qty 2

## 2013-10-13 MED ORDER — HYDROCODONE-ACETAMINOPHEN 5-325 MG PO TABS: 1.0000 | ORAL_TABLET | ORAL | Status: DC | PRN

## 2013-10-13 NOTE — ED Notes (Signed)
To ED with mother with c/o migraine since last night, reports vomiting, HA and vision changes, no neuro deficits noted, ambulatory and in NAD

## 2013-10-13 NOTE — ED Provider Notes (Signed)
CSN: [id number]     Arrival date & time 10/13/13  0906 History   First MD Initiated Contact with Patient 10/13/13 [phone]     Chief Complaint  Patient presents with  . Migraine    HPI Patient presents with migraine headache since last night.  She reports vomiting and headache as well as visual changes.  She reports that she has a long-standing history of headaches including complicated migraines which have resulted in right-sided weakness before in the past.  She does report some numbness and weakness of her right side.  The patient's care was initiated prior to my valuation with standard headache cocktail and by the time I evaluated the patient she reports complete resolution of all of her headache and wishes to go home.  No recent head trauma.  No fevers or chills.  No neck pain or neck stiffness.  No other complaints.  Prior to my evaluation and prior to medications the patient's pain is moderate to severe in severity.  Her pain is worsened by light and felt like a typical migraine for her.   Past Medical History  Diagnosis Date  . Migraine     hemiplegic  . Migraine    Past Surgical History  Procedure Laterality Date  . Cholecystectomy  2013  . Dental surgery     Family History  Problem Relation Age of Onset  . Cancer - Other Mother 7  . Hypertension Mother   . Hypertension Father   . Breast cancer Maternal Grandmother    History  Substance Use Topics  . Smoking status: Former Smoker -- 1.00 packs/day for 5 years  . Smokeless tobacco: Never Used  . Alcohol Use: Yes     Comment: 4/week   OB History   Grav Para Term Preterm Abortions TAB SAB Ect Mult Living   3         3     Obstetric Comments   Age first pregnancy 18     Review of Systems  All other systems reviewed and are negative.    Allergies  Latex  Home Medications   Current Outpatient Rx  Name  Route  Sig  Dispense  Refill  . amitriptyline (ELAVIL) 25 MG tablet   Oral   Take 2 tablets (50 mg total)  by mouth at bedtime.   60 tablet   3   . clonazePAM (KLONOPIN) 0.5 MG tablet   Oral   Take 0.5 mg by mouth 2 (two) times daily as needed for anxiety.          . DULoxetine (CYMBALTA) 30 MG capsule   Oral   Take 30-60 mg by mouth 2 (two) times daily. Takes 60mg  every morning and takes 30mg  every night at bedtime.         Marland Kitchen ketorolac (TORADOL) 30 MG/ML injection   Intramuscular   Inject 60 mg into the muscle every 6 (six) hours as needed for pain.         Marland Kitchen OLANZapine (ZYPREXA) 2.5 MG tablet   Oral   Take 2.5 mg by mouth at bedtime.          . pantoprazole (PROTONIX) 40 MG tablet   Oral   Take 40 mg by mouth daily.         . SEROQUEL XR 50 MG TB24 24 hr tablet   Oral   Take 50 mg by mouth daily as needed (sleep).          . tizanidine (ZANAFLEX) 2  MG capsule   Oral   Take 1 capsule (2 mg total) by mouth 3 (three) times daily as needed (for tension headache).   90 capsule   1   . HYDROcodone-acetaminophen (NORCO/VICODIN) 5-325 MG per tablet   Oral   Take 1 tablet by mouth every 4 (four) hours as needed for pain.   15 tablet   0   . promethazine (PHENERGAN) 25 MG suppository   Rectal   Place 1 suppository (25 mg total) rectally every 6 (six) hours as needed for nausea.   12 each   0    BP 103/69  Pulse 104  Temp(Src) 98.4 F (36.9 C) (Oral)  Resp 14  SpO2 100%  LMP 09/18/2013 Physical Exam  Nursing note and vitals reviewed. Constitutional: She is oriented to person, place, and time. She appears well-developed and well-nourished. No distress.  HENT:  Head: Normocephalic and atraumatic.  Eyes: EOM are normal.  Neck: Normal range of motion.  Cardiovascular: Normal rate, regular rhythm and normal heart sounds.   Pulmonary/Chest: Effort normal and breath sounds normal.  Abdominal: Soft. She exhibits no distension. There is no tenderness.  Musculoskeletal: Normal range of motion.  Neurological: She is alert and oriented to person, place, and time.   Skin: Skin is warm and dry.  Psychiatric: She has a normal mood and affect. Judgment normal.    ED Course  Procedures (including critical care time) Labs Review Labs Reviewed - No data to display Imaging Review No results found.  EKG Interpretation   None       MDM   1. Migraine    Discharge the patient felt much better.  Typical migraine headache.  No indication for imaging.    Lyanne Co, MD 10/13/13 [phone]

## 2013-10-17 ENCOUNTER — Encounter (HOSPITAL_COMMUNITY): Payer: Self-pay | Admitting: Emergency Medicine

## 2013-10-17 ENCOUNTER — Emergency Department (HOSPITAL_COMMUNITY)
Admission: EM | Admit: 2013-10-17 | Discharge: 2013-10-17 | Disposition: A | Discharge status: Home / Self Care | Payer: BC Managed Care – PPO | Attending: Emergency Medicine | Admitting: Emergency Medicine

## 2013-10-17 DIAGNOSIS — H539 Unspecified visual disturbance: Secondary | ICD-10-CM | POA: Insufficient documentation

## 2013-10-17 DIAGNOSIS — Z79899 Other long term (current) drug therapy: Secondary | ICD-10-CM | POA: Insufficient documentation

## 2013-10-17 DIAGNOSIS — G43909 Migraine, unspecified, not intractable, without status migrainosus: Secondary | ICD-10-CM | POA: Insufficient documentation

## 2013-10-17 DIAGNOSIS — Z91040 Latex allergy status: Secondary | ICD-10-CM | POA: Insufficient documentation

## 2013-10-17 DIAGNOSIS — Z87891 Personal history of nicotine dependence: Secondary | ICD-10-CM | POA: Insufficient documentation

## 2013-10-17 DIAGNOSIS — H53149 Visual discomfort, unspecified: Secondary | ICD-10-CM | POA: Insufficient documentation

## 2013-10-17 MED ORDER — SODIUM CHLORIDE 0.9 % IV BOLUS (SEPSIS)
500.0000 mL | Freq: Once | INTRAVENOUS | Status: AC
  Administered 2013-10-17: 500 mL via INTRAVENOUS

## 2013-10-17 MED ORDER — PROCHLORPERAZINE EDISYLATE 5 MG/ML IJ SOLN
10.0000 mg | Freq: Once | INTRAMUSCULAR | Status: AC
  Administered 2013-10-17: 10 mg via INTRAVENOUS
  Filled 2013-10-17: qty 2

## 2013-10-17 MED ORDER — METHOCARBAMOL 100 MG/ML IJ SOLN: 500.0000 mg | Freq: Once | INTRAMUSCULAR | Status: DC

## 2013-10-17 MED ORDER — VALPROATE SODIUM 500 MG/5ML IV SOLN
500.0000 mg | Freq: Once | INTRAVENOUS | Status: DC
  Filled 2013-10-17: qty 5

## 2013-10-17 MED ORDER — DIPHENHYDRAMINE HCL 50 MG/ML IJ SOLN
25.0000 mg | Freq: Once | INTRAMUSCULAR | Status: AC
  Administered 2013-10-17: 25 mg via INTRAVENOUS
  Filled 2013-10-17: qty 1

## 2013-10-17 MED ORDER — DEXTROSE 5 % IV SOLN
500.0000 mg | Freq: Once | INTRAVENOUS | Status: DC
  Filled 2013-10-17: qty 5

## 2013-10-17 NOTE — ED Notes (Signed)
Dr Stewart at bedside.

## 2013-10-17 NOTE — Consult Note (Signed)
Reason for Consult: Migraine headache.  HPI:                                                                                                                                          [name] is an 27 y.o. female history of migraine headaches presenting with severe headache which started on 10/16/2013. Patient has had associated nausea and vomiting as well as photophobia. Pain is throbbing in quality with 10/10 in intensity. This patient's 10th emergency room visit the past 30 days. Last visit was 3 days ago. She took a total of 60 mg of Toradol at home prior to the emergency room, with no relief from pain. She's also complaining of numbness involving right face, arm and leg. She typically gets hemisensory changes with migraine headaches, which resolved when the headache resolves.  Past Medical History  Diagnosis Date  . Migraine     hemiplegic  . Migraine     Past Surgical History  Procedure Laterality Date  . Cholecystectomy  2013  . Dental surgery      Family History  Problem Relation Age of Onset  . Cancer - Other Mother 9  . Hypertension Mother   . Hypertension Father   . Breast cancer Maternal Grandmother     Social History:  reports that she has quit smoking. She has never used smokeless tobacco. She reports that she drinks alcohol. She reports that she does not use illicit drugs.  Allergies  Allergen Reactions  . Latex Rash    MEDICATIONS:                                                                                                                     I have reviewed the patient's current medications.   ROS:  History obtained from the patient  General ROS: negative for - chills, fatigue, fever, night sweats, weight gain or weight loss Psychological ROS: negative for - behavioral disorder, hallucinations, memory difficulties, mood  swings or suicidal ideation Ophthalmic ROS: negative for - blurry vision, double vision, eye pain or loss of vision ENT ROS: negative for - epistaxis, nasal discharge, oral lesions, sore throat, tinnitus or vertigo Allergy and Immunology ROS: negative for - hives or itchy/watery eyes Hematological and Lymphatic ROS: negative for - bleeding problems, bruising or swollen lymph nodes Endocrine ROS: negative for - galactorrhea, hair pattern changes, polydipsia/polyuria or temperature intolerance Respiratory ROS: negative for - cough, hemoptysis, shortness of breath or wheezing Cardiovascular ROS: negative for - chest pain, dyspnea on exertion, edema or irregular heartbeat Gastrointestinal ROS: negative for - abdominal pain, diarrhea, hematemesis, nausea/vomiting or stool incontinence Genito-Urinary ROS: negative for - dysuria, hematuria, incontinence or urinary frequency/urgency Musculoskeletal ROS: negative for - joint swelling or muscular weakness Neurological ROS: as noted in HPI Dermatological ROS: negative for rash and skin lesion changes   Blood pressure 125/78, pulse 109, temperature 98.1 F (36.7 C), temperature source Oral, resp. rate 20, height 5\' 4"  (1.626 m), weight 68.04 kg (150 lb), last menstrual period 09/18/2013, SpO2 98.00%.   Neurologic Examination:                                                                                                      Mental Status: Alert, oriented, complaining of severe headache and photophobia.  Speech fluent without evidence of aphasia. Able to follow commands without difficulty. Cranial Nerves: II-Visual fields were normal. III/IV/VI-Pupils were equal and reacted. Extraocular movements were full and conjugate.    V/VII-mild right facial numbness; no facial weakness. VIII-normal. X-normal speech. Motor: 5/5 bilaterally with normal tone and bulk Sensory: Reduced perception of tactile sensation over right extremities compared to left  extremities. Deep Tendon Reflexes: 1+ and symmetric. Plantars: Flexor bilaterally Cerebellar: Normal finger-to-nose testing.  No results found for this basename: cbc, bmp, coags, chol, tri, ldl, hga1c    No results found for this or any previous visit (from the past 48 hour(s)).  No results found.   Assessment/Plan: Recurrent migraine headache with associated right sensory changes as well. Examination was otherwise unremarkable with no other focal deficits.  Recommend a trial of Depacon 500 mg IV, and Robaxin 500 mg IV. I will continue to see this patient in followup for headache management as needed.  C.R. Roseanne Reno, MD [facility] [phone]  10/17/2013, 9:39 AM

## 2013-10-17 NOTE — ED Provider Notes (Signed)
CSN: [id number]     Arrival date & time 10/17/13  1027 History   First MD Initiated Contact with Patient 10/17/13 [phone]     Chief Complaint  Patient presents with  . Headache   (Consider location/radiation/quality/duration/timing/severity/associated sxs/prior Treatment) Patient is a 27 y.o. female presenting with headaches. The history is provided by the patient.  Headache Associated symptoms: photophobia   Associated symptoms: no abdominal pain, no back pain, no diarrhea, no pain, no nausea, no neck stiffness, no numbness and no vomiting    patient with chronic migraines. States began again last night. It is her typical throbbing headache. She's had nausea with some vomiting. No relief with her IM Toradol and Phenergan at home. She states she has a black dot in her right eye and loss of vision in her left eye. She typically has neurologic deficits with her headaches. She's been seen by neurology. She has 10 visits to the ED in the last 2 months. No fevers. No trauma.  Past Medical History  Diagnosis Date  . Migraine     hemiplegic  . Migraine    Past Surgical History  Procedure Laterality Date  . Cholecystectomy  2013  . Dental surgery     Family History  Problem Relation Age of Onset  . Cancer - Other Mother 42  . Hypertension Mother   . Hypertension Father   . Breast cancer Maternal Grandmother    History  Substance Use Topics  . Smoking status: Former Smoker -- 1.00 packs/day for 5 years  . Smokeless tobacco: Never Used  . Alcohol Use: Yes     Comment: 4/week   OB History   Grav Para Term Preterm Abortions TAB SAB Ect Mult Living   3         3     Obstetric Comments   Age first pregnancy 18     Review of Systems  Constitutional: Negative for activity change and appetite change.  Eyes: Positive for photophobia and visual disturbance. Negative for pain.  Respiratory: Negative for chest tightness and shortness of breath.   Cardiovascular: Negative for chest pain  and leg swelling.  Gastrointestinal: Negative for nausea, vomiting, abdominal pain and diarrhea.  Genitourinary: Negative for flank pain.  Musculoskeletal: Negative for back pain and neck stiffness.  Skin: Negative for rash.  Neurological: Positive for headaches. Negative for weakness and numbness.  Psychiatric/Behavioral: Negative for behavioral problems.    Allergies  Latex  Home Medications   Current Outpatient Rx  Name  Route  Sig  Dispense  Refill  . amitriptyline (ELAVIL) 25 MG tablet   Oral   Take 50 mg by mouth at bedtime.         . clonazePAM (KLONOPIN) 0.5 MG tablet   Oral   Take 0.5 mg by mouth 2 (two) times daily as needed for anxiety.          . DULoxetine (CYMBALTA) 30 MG capsule   Oral   Take 30-60 mg by mouth 2 (two) times daily. Takes 60mg  every morning and takes 30mg  every night at bedtime.         Marland Kitchen HYDROcodone-acetaminophen (NORCO/VICODIN) 5-325 MG per tablet   Oral   Take 1 tablet by mouth every 6 (six) hours as needed for pain.         Marland Kitchen ketorolac (TORADOL) 30 MG/ML injection   Intramuscular   Inject 60 mg into the muscle every 6 (six) hours as needed for pain.         Marland Kitchen  OLANZapine (ZYPREXA) 2.5 MG tablet   Oral   Take 2.5 mg by mouth at bedtime.          . pantoprazole (PROTONIX) 40 MG tablet   Oral   Take 40 mg by mouth daily.         . promethazine (PHENERGAN) 25 MG suppository   Rectal   Place 25 mg rectally every 6 (six) hours as needed for nausea.         . SEROQUEL XR 50 MG TB24 24 hr tablet   Oral   Take 50 mg by mouth daily as needed (sleep).          Marland Kitchen tiZANidine (ZANAFLEX) 2 MG tablet   Oral   Take 2 mg by mouth every 8 (eight) hours as needed (for tension headaches).           BP 125/78  Pulse 109  Temp(Src) 98.1 F (36.7 C) (Oral)  Resp 20  Ht 5\' 4"  (1.626 m)  Wt 150 lb (68.04 kg)  BMI 25.73 kg/m2  SpO2 98%  LMP 09/18/2013 Physical Exam  Nursing note and vitals reviewed. Constitutional: She is  oriented to person, place, and time. She appears well-developed and well-nourished.  Patient is tearful.  HENT:  Head: Normocephalic and atraumatic.  Eyes: EOM are normal. Pupils are equal, round, and reactive to light.  Pupils are mildly dilated  Neck: Normal range of motion. Neck supple.  Cardiovascular: Normal rate, regular rhythm and normal heart sounds.   No murmur heard. Pulmonary/Chest: Effort normal and breath sounds normal. No respiratory distress. She has no wheezes. She has no rales.  Abdominal: Soft. Bowel sounds are normal. She exhibits no distension. There is no tenderness. There is no rebound and no guarding.  Musculoskeletal: Normal range of motion.  Neurological: She is alert and oriented to person, place, and time. No cranial nerve deficit.  Skin: Skin is warm and dry.  Psychiatric: She has a normal mood and affect. Her speech is normal.    ED Course  Procedures (including critical care time) Labs Review Labs Reviewed - No data to display Imaging Review No results found.  EKG Interpretation   None       MDM  No diagnosis found. Patient with acute on chronic headaches. Seen by neurology. He recommended Depakote and Robaxin. Patient received Compazine and Benadryl. She states she felt somewhat better after this and no longer want to stay in the ED. She is not willing to stay for the Depakote on Robaxin to help prevent recurrence. She was discharged    Juliet Rude. Rubin Payor, MD 10/17/13 1036

## 2013-10-17 NOTE — ED Notes (Signed)
Patient discharged to home with family. NAD.  

## 2013-10-17 NOTE — ED Notes (Signed)
Patient presents to ED with complaints of headache since last night. Patient has history of migraines. Patient took Toradol and phenergan with no relief.

## 2013-10-20 ENCOUNTER — Emergency Department (HOSPITAL_COMMUNITY)
Admission: EM | Admit: 2013-10-20 | Discharge: 2013-10-20 | Disposition: A | Discharge status: Home / Self Care | Payer: BC Managed Care – PPO | Attending: Emergency Medicine | Admitting: Emergency Medicine

## 2013-10-20 ENCOUNTER — Encounter (HOSPITAL_COMMUNITY): Payer: Self-pay | Admitting: Emergency Medicine

## 2013-10-20 DIAGNOSIS — Z8669 Personal history of other diseases of the nervous system and sense organs: Secondary | ICD-10-CM | POA: Insufficient documentation

## 2013-10-20 DIAGNOSIS — H53149 Visual discomfort, unspecified: Secondary | ICD-10-CM | POA: Insufficient documentation

## 2013-10-20 DIAGNOSIS — R51 Headache: Secondary | ICD-10-CM | POA: Insufficient documentation

## 2013-10-20 DIAGNOSIS — Z79899 Other long term (current) drug therapy: Secondary | ICD-10-CM | POA: Insufficient documentation

## 2013-10-20 DIAGNOSIS — H538 Other visual disturbances: Secondary | ICD-10-CM | POA: Insufficient documentation

## 2013-10-20 DIAGNOSIS — Z87891 Personal history of nicotine dependence: Secondary | ICD-10-CM | POA: Insufficient documentation

## 2013-10-20 DIAGNOSIS — R209 Unspecified disturbances of skin sensation: Secondary | ICD-10-CM | POA: Insufficient documentation

## 2013-10-20 DIAGNOSIS — Z91040 Latex allergy status: Secondary | ICD-10-CM | POA: Insufficient documentation

## 2013-10-20 DIAGNOSIS — R112 Nausea with vomiting, unspecified: Secondary | ICD-10-CM | POA: Insufficient documentation

## 2013-10-20 DIAGNOSIS — R519 Headache, unspecified: Secondary | ICD-10-CM

## 2013-10-20 MED ORDER — METHOCARBAMOL 100 MG/ML IJ SOLN
500.0000 mg | Freq: Once | INTRAMUSCULAR | Status: AC
  Administered 2013-10-20: 500 mg via INTRAVENOUS
  Filled 2013-10-20: qty 5

## 2013-10-20 MED ORDER — KETOROLAC TROMETHAMINE 30 MG/ML IJ SOLN: 30.0000 mg | Freq: Once | INTRAMUSCULAR | Status: DC

## 2013-10-20 MED ORDER — DEXAMETHASONE SODIUM PHOSPHATE 10 MG/ML IJ SOLN
10.0000 mg | Freq: Once | INTRAMUSCULAR | Status: AC
  Administered 2013-10-20: 10 mg via INTRAVENOUS
  Filled 2013-10-20: qty 1

## 2013-10-20 MED ORDER — METHOCARBAMOL 100 MG/ML IJ SOLN
500.0000 mg | Freq: Once | INTRAMUSCULAR | Status: DC
  Filled 2013-10-20: qty 5

## 2013-10-20 MED ORDER — METOCLOPRAMIDE HCL 5 MG/ML IJ SOLN
10.0000 mg | INTRAMUSCULAR | Status: AC
  Administered 2013-10-20: 10 mg via INTRAVENOUS
  Filled 2013-10-20: qty 2

## 2013-10-20 MED ORDER — SODIUM CHLORIDE 0.9 % IV BOLUS (SEPSIS)
1000.0000 mL | Freq: Once | INTRAVENOUS | Status: AC
  Administered 2013-10-20: 1000 mL via INTRAVENOUS

## 2013-10-20 NOTE — ED Provider Notes (Signed)
Medical screening examination/treatment/procedure(s) were performed by non-physician practitioner and as supervising physician I was immediately available for consultation/collaboration.  EKG Interpretation   None         Charles B. Sheldon, MD 10/20/13 1607 

## 2013-10-20 NOTE — ED Provider Notes (Signed)
CSN: [id number]     Arrival date & time 10/20/13  4098 History   First MD Initiated Contact with Patient 10/20/13 0804     Chief Complaint  Patient presents with  . Migraine   (Consider location/radiation/quality/duration/timing/severity/associated sxs/prior Treatment) HPI Comments: Patient is a 27 year old female with a history of migraine headaches who presents for atypical migraine with onset yesterday. She has been seen multiple times in the ED this month for same. Patient endorses associated nausea with one episode of nonbloody, nonbilious emesis yesterday. She also admits to associated photophobia, phonophobia, intermittent blurry vision in her right eye and subjective tingling from her right cheek down to her right thigh. All of these associated symptoms are typical of her migraine headaches. Patient has taken a Phenergan suppository, hydrocodone, and amitriptyline for symptoms without relief. She denies associated fever, vision loss, neck pain/stiffness, SOB, difficulty speaking or swallowing, abdominal pain, and an inability to ambulate.  Neurologist - Dr. Pearlean Brownie. States she has follow up on 10/30/13.  Patient is a 27 y.o. female presenting with migraines. The history is provided by the patient. No language interpreter was used.  Migraine Associated symptoms include headaches, nausea and vomiting. Pertinent negatives include no abdominal pain, chest pain, fever, neck pain or weakness.    Past Medical History  Diagnosis Date  . Migraine     hemiplegic  . Migraine    Past Surgical History  Procedure Laterality Date  . Cholecystectomy  2013  . Dental surgery     Family History  Problem Relation Age of Onset  . Cancer - Other Mother 32  . Hypertension Mother   . Hypertension Father   . Breast cancer Maternal Grandmother    History  Substance Use Topics  . Smoking status: Former Smoker -- 1.00 packs/day for 5 years  . Smokeless tobacco: Never Used  . Alcohol Use: Yes   Comment: 4/week   OB History   Grav Para Term Preterm Abortions TAB SAB Ect Mult Living   3         3     Obstetric Comments   Age first pregnancy 18     Review of Systems  Constitutional: Negative for fever.  Eyes: Positive for photophobia and visual disturbance.  Respiratory: Negative for shortness of breath.   Cardiovascular: Negative for chest pain.  Gastrointestinal: Positive for nausea and vomiting. Negative for abdominal pain.  Musculoskeletal: Negative for neck pain and neck stiffness.  Neurological: Positive for headaches. Negative for syncope and weakness.  All other systems reviewed and are negative.    Allergies  Latex  Home Medications   Current Outpatient Rx  Name  Route  Sig  Dispense  Refill  . amitriptyline (ELAVIL) 25 MG tablet   Oral   Take 50 mg by mouth at bedtime.         . clonazePAM (KLONOPIN) 0.5 MG tablet   Oral   Take 0.5 mg by mouth 2 (two) times daily as needed for anxiety.          . DULoxetine (CYMBALTA) 30 MG capsule   Oral   Take 30-60 mg by mouth 2 (two) times daily. Takes 60mg  every morning and takes 30mg  every night at bedtime.         Marland Kitchen HYDROcodone-acetaminophen (NORCO/VICODIN) 5-325 MG per tablet   Oral   Take 1 tablet by mouth every 6 (six) hours as needed for pain.         . pantoprazole (PROTONIX) 40 MG tablet  Oral   Take 40 mg by mouth daily.         . promethazine (PHENERGAN) 25 MG suppository   Rectal   Place 25 mg rectally every 6 (six) hours as needed for nausea.         . SEROQUEL XR 50 MG TB24 24 hr tablet   Oral   Take 50 mg by mouth daily as needed (sleep).           BP 111/75  Pulse 86  Temp(Src) 97.8 F (36.6 C) (Oral)  Resp 18  SpO2 100%  LMP 09/18/2013  Physical Exam  Nursing note and vitals reviewed. Constitutional: She is oriented to person, place, and time. She appears well-developed and well-nourished. No distress.  HENT:  Head: Normocephalic and atraumatic.  Mouth/Throat:  Oropharynx is clear and moist. No oropharyngeal exudate.  Eyes: Conjunctivae and EOM are normal. Pupils are equal, round, and reactive to light. No scleral icterus.  Neck: Normal range of motion. Neck supple.  Cardiovascular: Normal rate, regular rhythm and normal heart sounds.   Pulmonary/Chest: Effort normal. No respiratory distress. She has no wheezes. She has no rales.  Musculoskeletal: Normal range of motion.  Neurological: She is alert and oriented to person, place, and time. She has normal strength. No cranial nerve deficit or sensory deficit. GCS eye subscore is 4. GCS verbal subscore is 5. GCS motor subscore is 6.  Patient speaks in full goal oriented sentences. No focal neurologic deficits appreciated. Moves extremities without ataxia. Normal grip strength, reflexes, and strength against resistance. Finger to nose intact.  Skin: Skin is warm and dry. No rash noted. She is not diaphoretic. No erythema. No pallor.  Psychiatric: She has a normal mood and affect. Her behavior is normal.    ED Course  Procedures (including critical care time) Labs Review Labs Reviewed - No data to display Imaging Review No results found.  EKG Interpretation   None       MDM   1. Headache    27 year old female with a history of migraine headaches presents for a typical migraine with onset yesterday. Patient is without focal neurologic deficits. She is well and nontoxic appearing, hemodynamically stable, and afebrile. No nuchal rigidity or meningismus. Patient has been seen multiple times over the last few weeks in the ED for similar complaints.  Patient treated in ED with IV fluids, Reglan, Decadron, and Robaxin. Patient states that pain is down from 10/10 to 5/10 and that she feels comfortable managing the remainder of her symptoms at home. Patient is hemodynamically stable and appropriate for discharge with neurology followup. Return precautions provided and patient agreeable to plan.   Filed  Vitals:   10/20/13 1000 10/20/13 1015 10/20/13 1030 10/20/13 1045  BP: 103/66 113/78 103/64 111/75  Pulse: 92 85 88 86  Temp:      TempSrc:      Resp:      SpO2: 100% 100% 100% 100%       Antony Madura, PA-C 10/20/13 1152

## 2013-10-20 NOTE — ED Notes (Signed)
Pt alert and mentating appropriately upon d/c. Pt given d/c teaching and follow up care instructions. Pt verbalizes understanding of d/c teaching and has no further questions upon d/c. Pt ambulatory with steady gait upon d/c. Pt instructed not to drive. Pt mother at bedside endorses pt will not be driving home. NAD noted upon d/c. Pt leaving with d/c teaching.

## 2013-10-20 NOTE — ED Notes (Signed)
Per pt sts migraine since yesterday. sts yesterday took rescue medication. sts didn't help. sts some nausea.

## 2013-10-20 NOTE — ED Notes (Signed)
Kelly Humes, PA at bedside. 

## 2013-10-22 ENCOUNTER — Other Ambulatory Visit: Payer: Self-pay | Admitting: *Deleted

## 2013-10-25 ENCOUNTER — Encounter: Payer: Self-pay | Admitting: Neurology

## 2013-10-25 ENCOUNTER — Ambulatory Visit (INDEPENDENT_AMBULATORY_CARE_PROVIDER_SITE_OTHER): Payer: BC Managed Care – PPO | Admitting: Neurology

## 2013-10-25 VITALS — BP 110/77 | HR 96 | Temp 98.2°F | Ht 65.75 in | Wt 157.0 lb

## 2013-10-25 DIAGNOSIS — G43019 Migraine without aura, intractable, without status migrainosus: Secondary | ICD-10-CM

## 2013-10-25 MED ORDER — TRAMADOL HCL 50 MG PO TABS: 100.0000 mg | ORAL_TABLET | Freq: Four times a day (QID) | ORAL | Status: DC | PRN

## 2013-10-25 NOTE — Progress Notes (Signed)
[facility]  PATIENT: [name] DOB: 1986-09-05   REASON FOR VISIT: follow up HISTORY FROM: patient  HISTORY OF PRESENT ILLNESS: 17 year Caucasian lady with migraine headaches since 6 th grade in school off and on which have increased in last 5 months to 1-2 per week.These are severe throbbing periorbital and occipital, present upon awakening and last 1-2 days.there is nausea, vomitting and worsening with activity and relief with sleep. 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 She is currently on Topamax 400 mg without clear efficacy.She has not tried elavil or zanaflex for prophylaxis.she was seen recently in [facility] with several days of headache, dehydration.She has had brain imaging studies in past but my review of [facility] imaging records and our office notes do not mention any reference to these.She takes toradol 60 mg and phenergan 25 mg im self injections for symptomatic relief 1-2 times per month   UPDATE 09/21/13 (LL): Patient comes in for acute visit, having severe Migraine for 3 days.  Last ER visit was 09/16/13 in which she received headache Toradol, Zofran, Morphine, Decadron, and 1,000 mL NS bolus.  She states she cannot even keep water down due to nausea and vomiting.  She reports blurred vision and black spots in right eye and tingling on right side of face, arm and leg up to knee.  She has not had MRI/MRA done yet. UPDATE 10/25/13 She is seen urgently today as she  called complaining of persistent severe headaches. She has gone to the emergency room several times and states is having 2-3 bad headaches a week now. She does not find relief with the current medications and has gone to the ER and received diluted injections. She did not take some fracture on a daily basis as I had instructed but instead took it on an as needed basis couple of doses and did not have it effective and stopped. She continues to take amitriptyline 50 mg at night. She underwent MRI scan of the brain and MRA of the brain on 10/06/13 at [facility] which were both normal. 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 The numbness lasts a couple of days where as the vision improves. She finds relief only by going to sleep.  REVIEW OF SYSTEMS: Full 14 system review of systems performed and notable only for:  Headache, nausea, numbness, visual loss, light and sound sensitivity  ALLERGIES: Allergies  Allergen Reactions  . Latex Rash    HOME MEDICATIONS: Outpatient Prescriptions Prior to Visit  Medication Sig Dispense Refill  . DULoxetine (CYMBALTA) 60 MG capsule Take 30-60 mg by mouth 2 (two) times daily.       Marland Kitchen ketorolac (TORADOL) 30 MG/ML injection Inject 60 mg into the muscle every 6 (six) hours as needed for pain.      . pantoprazole (PROTONIX) 40 MG tablet Take 40 mg by mouth daily.       No facility-administered medications prior to visit.   . clonazePAM (  KLONOPIN) 0.5 MG tablet    Sig:   . OLANZapine (ZYPREXA) 2.5 MG tablet    Sig:   . mirtazapine (REMERON) 45 MG tablet    Sig:   . promethazine (PHENERGAN) 25 MG tablet    Sig:   . SEROQUEL XR 50 MG TB24 24 hr tablet    Sig:     PAST MEDICAL HISTORY: Past Medical History  Diagnosis Date  . Migraine     hemiplegic  . Migraine     PAST SURGICAL HISTORY: Past Surgical History  Procedure Laterality Date  . Cholecystectomy  2013  . Dental surgery       FAMILY HISTORY: Family History  Problem Relation Age of Onset  . Cancer - Other Mother 7  . Hypertension Mother   . Hypertension Father   . Breast cancer Maternal Grandmother     SOCIAL HISTORY: History   Social History  . Marital Status: Married    Spouse Name: dan    Number of Children: 3  . Years of Education: college   Occupational History  . N/A    Social History Main Topics  . Smoking status: Former Smoker -- 1.00 packs/day for 5 years  . Smokeless tobacco: Never Used  . Alcohol Use: Yes     Comment: 4/week  . Drug Use: No  . Sexual Activity: No   Other Topics Concern  . Not on file   Social History Narrative  . No narrative on file    PHYSICAL EXAM  Filed Vitals:   10/25/13 1511  BP: 110/77  Pulse: 96  Temp: 98.2 F (36.8 C)  TempSrc: Oral  Height: 5' 5.75" (1.67 m)  Weight: 157 lb (71.215 kg)   Body mass index is 25.54 kg/(m^2).  General: well developed, well nourished young Caucasian, seated, in evident mild distress due to headache  Head: head normocephalic and atraumatic. Orohparynx benign  Neck: supple with no carotid or supraclavicular bruits  Cardiovascular: regular rate and rhythm, no murmurs  Musculoskeletal: no deformity  Skin: no rash/petichiae  Vascular: Normal pulses all extremities   Neurological examination   Mentation: Alert oriented to time, place, history taking. Follows all commands speech and language fluent Cranial nerve II-XII: Fundoscopic exam reveals sharp disc margins.Pupils were equal round reactive to light extraocular movements were full, visual field were full on confrontational test. Facial sensation and strength were normal. hearing was intact to finger rubbing bilaterally. Uvula tongue midline. head turning and shoulder shrug and were normal and symmetric.Tongue protrusion into cheek strength was normal. Motor: Normal bulk and tone. Normal strength in all tested extremity muscles.  Sensory.: diminished touch,  pinprick on right lower face, chest ,upper extremity and right thigh upto knee but with splitting of midline.splits vibration over forehead.  Coordination: Rapid alternating movements normal in all extremities. Finger-to-nose and heel-to-shin performed accurately bilaterally.  Gait and Station: Arises from chair without difficulty. Stance is normal. Gait demonstrates normal stride length and balance . Able to heel, toe and tandem walk without difficulty.  Reflexes: 1+ and symmetric.   DIAGNOSTIC DATA (LABS, IMAGING, TESTING) - I reviewed patient records, labs, notes, testing and imaging myself where available.   ASSESSMENT AND PLAN 86 year Caucasian lady with long standing refractory migraines with complicated migraine features.   PLAN:  Change zanaflex to 2 mh hs daily and increase as tolerated. Continue amitrptylline 50 mg hs for prophylaxis and Demerol 100 mg every 6 hourly but maximum 2 days per week and toradol/phenargan injections for rescue  symptomatic relief. Return for followup in 2 months with Levester Fresh. May consider Speno palatine ganglion block  With sphenocath after insurance approval.   Delia Heady, MD  10/25/2013, 5:33 PM [facility] [address], [address] [place], [place] [address] [phone]

## 2013-10-25 NOTE — Patient Instructions (Addendum)
Change zanaflex to 2 mh hs daily and increase as tolerated. Continue amitrptylline 50 mg hs for prophylaxis and Demerol 100 mg every 6 hourly but maximum 2 days per week and toradol/phenargan injections for rescue symptomatic relief. Return for followup in 2 months with Levester Fresh. May consider Speno palatine ganglion block  With sphenocath after insurance approval.

## 2013-10-31 ENCOUNTER — Emergency Department (HOSPITAL_COMMUNITY)
Admission: EM | Admit: 2013-10-31 | Discharge: 2013-10-31 | Disposition: A | Discharge status: Home / Self Care | Payer: BC Managed Care – PPO | Attending: Emergency Medicine | Admitting: Emergency Medicine

## 2013-10-31 ENCOUNTER — Encounter (HOSPITAL_COMMUNITY): Payer: Self-pay | Admitting: Emergency Medicine

## 2013-10-31 DIAGNOSIS — R109 Unspecified abdominal pain: Secondary | ICD-10-CM | POA: Insufficient documentation

## 2013-10-31 DIAGNOSIS — R112 Nausea with vomiting, unspecified: Secondary | ICD-10-CM | POA: Insufficient documentation

## 2013-10-31 DIAGNOSIS — R209 Unspecified disturbances of skin sensation: Secondary | ICD-10-CM | POA: Insufficient documentation

## 2013-10-31 DIAGNOSIS — Z87891 Personal history of nicotine dependence: Secondary | ICD-10-CM | POA: Insufficient documentation

## 2013-10-31 DIAGNOSIS — Z79899 Other long term (current) drug therapy: Secondary | ICD-10-CM | POA: Insufficient documentation

## 2013-10-31 DIAGNOSIS — Z91040 Latex allergy status: Secondary | ICD-10-CM | POA: Insufficient documentation

## 2013-10-31 DIAGNOSIS — R000 Tachycardia, unspecified: Secondary | ICD-10-CM | POA: Insufficient documentation

## 2013-10-31 DIAGNOSIS — G43909 Migraine, unspecified, not intractable, without status migrainosus: Secondary | ICD-10-CM

## 2013-10-31 MED ORDER — METOCLOPRAMIDE HCL 5 MG/ML IJ SOLN
10.0000 mg | Freq: Once | INTRAMUSCULAR | Status: AC
  Administered 2013-10-31: 10 mg via INTRAVENOUS
  Filled 2013-10-31: qty 2

## 2013-10-31 MED ORDER — DIPHENHYDRAMINE HCL 50 MG/ML IJ SOLN
25.0000 mg | Freq: Once | INTRAMUSCULAR | Status: AC
  Administered 2013-10-31: 25 mg via INTRAVENOUS
  Filled 2013-10-31: qty 1

## 2013-10-31 MED ORDER — KETOROLAC TROMETHAMINE 30 MG/ML IJ SOLN
30.0000 mg | Freq: Once | INTRAMUSCULAR | Status: AC
  Administered 2013-10-31: 30 mg via INTRAVENOUS
  Filled 2013-10-31: qty 1

## 2013-10-31 MED ORDER — SODIUM CHLORIDE 0.9 % IV BOLUS (SEPSIS)
1000.0000 mL | Freq: Once | INTRAVENOUS | Status: AC
  Administered 2013-10-31: 1000 mL via INTRAVENOUS

## 2013-10-31 NOTE — ED Notes (Signed)
The pt has had a headache since last night with nv.  She has face numbness that goes along with the headache.  Hx of migraine headaches

## 2013-10-31 NOTE — ED Notes (Signed)
Pt states she feels better and is ready to go.

## 2013-10-31 NOTE — ED Notes (Addendum)
Onset this morning headache, nausea, vomiting.  Last vomit around 12:30p.  Took Tramadol and a Phenergan supp early this morning without relief.  Pt tearful.

## 2013-10-31 NOTE — ED Provider Notes (Signed)
CSN: [id number]     Arrival date & time 10/31/13  1510 History   First MD Initiated Contact with Patient 10/31/13 1745     Chief Complaint  Patient presents with  . Headache   (Consider location/radiation/quality/duration/timing/severity/associated sxs/prior Treatment) The history is provided by the patient. No language interpreter was used.  [name] is a 27 y/o F with PMHx of migraines presenting to the ED with a migraine that started this morning. Patient reported that the migraine is localized to the left side of the face, described as a constant throbbing sensation that has gotten progressively worse over the course of the day. Patient reported that the pain radiates behind her ears. Patient reported that she has been experiencing black dots in her field of vision of her right eye. Stated that she has been experiencing right sided facial numbness. Reported that she has been feeling nauseous all day. Stated that she has had at least 4 episodes of emesis - NB/NB. Reported that she has been experiencing abdominal pain. Reported that she used a phenergan suppository to aid in reduction of nausea for her to take Tramadol with minimal relief. Patient reported that nothing has made the pain better and nothing makes the pain worse. Reported photophobia. As per patient, reported that when she gets a migraine these are the symptoms that she presents with. Denied phonophobia, dizziness, chest pain, shortness of breath, difficulty breathing, melena, hematochezia, diarrhea, fall, head injury, worst headache of life, neck pain, sudden loss of vision, neck stiffness. Neurologist Dr. Pearlean Brownie  Past Medical History  Diagnosis Date  . Migraine     hemiplegic  . Migraine    Past Surgical History  Procedure Laterality Date  . Cholecystectomy  2013  . Dental surgery     Family History  Problem Relation Age of Onset  . Cancer - Other Mother 54  . Hypertension Mother   . Hypertension Father   . Breast  cancer Maternal Grandmother    History  Substance Use Topics  . Smoking status: Former Smoker -- 1.00 packs/day for 5 years  . Smokeless tobacco: Never Used  . Alcohol Use: Yes     Comment: 4/week   OB History   Grav Para Term Preterm Abortions TAB SAB Ect Mult Living   3         3     Obstetric Comments   Age first pregnancy 18     Review of Systems  Constitutional: Negative for fever and chills.  Eyes: Positive for photophobia, pain and visual disturbance.  Respiratory: Negative for chest tightness and shortness of breath.   Cardiovascular: Negative for chest pain.  Gastrointestinal: Positive for nausea, vomiting and abdominal pain. Negative for diarrhea and constipation.  Musculoskeletal: Negative for back pain, neck pain and neck stiffness.  Neurological: Positive for numbness and headaches. Negative for dizziness and weakness.  All other systems reviewed and are negative.    Allergies  Latex  Home Medications   Current Outpatient Rx  Name  Route  Sig  Dispense  Refill  . amitriptyline (ELAVIL) 25 MG tablet   Oral   Take 50 mg by mouth at bedtime.         . DULoxetine (CYMBALTA) 30 MG capsule   Oral   Take 30-60 mg by mouth 2 (two) times daily. Takes 60mg  every morning and takes 30mg  every night at bedtime.         . hydrOXYzine (ATARAX/VISTARIL) 50 MG tablet      as  needed.         . pantoprazole (PROTONIX) 40 MG tablet   Oral   Take 40 mg by mouth daily.         . promethazine (PHENERGAN) 25 MG suppository   Rectal   Place 25 mg rectally every 6 (six) hours as needed for nausea.         Marland Kitchen QUEtiapine (SEROQUEL) 400 MG tablet   Oral   Take 400 mg by mouth at bedtime.         . SEROQUEL XR 50 MG TB24 24 hr tablet   Oral   Take 50 mg by mouth daily as needed (sleep).          Marland Kitchen tiZANidine (ZANAFLEX) 2 MG tablet   Oral   Take 2 mg by mouth every 6 (six) hours as needed (pain).         . traMADol (ULTRAM) 50 MG tablet   Oral   Take  2 tablets (100 mg total) by mouth every 6 (six) hours as needed for pain. Max 2 days per week   30 tablet   2   . zolpidem (AMBIEN) 10 MG tablet                BP 116/83  Pulse 100  Temp(Src) 97.9 F (36.6 C) (Oral)  Resp 18  SpO2 98%  LMP 09/18/2013 Physical Exam  Nursing note and vitals reviewed. Constitutional: She is oriented to person, place, and time. She appears well-developed and well-nourished. No distress.  Patient found laying in bed with lights off in the room, tearful  HENT:  Head: Normocephalic and atraumatic.  Mouth/Throat: Oropharynx is clear and moist. No oropharyngeal exudate.  Eyes: Conjunctivae and EOM are normal. Pupils are equal, round, and reactive to light. Right eye exhibits no discharge. Left eye exhibits no discharge.  Neck: Normal range of motion. Neck supple.  Cardiovascular: Regular rhythm and normal heart sounds.  Exam reveals no friction rub.   No murmur heard. Pulses:      Radial pulses are 2+ on the right side, and 2+ on the left side.       Dorsalis pedis pulses are 2+ on the right side, and 2+ on the left side.  Tachycardia   Pulmonary/Chest: Effort normal and breath sounds normal. No respiratory distress. She has no wheezes. She has no rales.  Abdominal: Soft. Bowel sounds are normal. There is no tenderness.  Musculoskeletal: Normal range of motion.  Full ROM to upper and lower extremities bilaterally without difficulty noted  Lymphadenopathy:    She has no cervical adenopathy.  Neurological: She is alert and oriented to person, place, and time. No cranial nerve deficit. She exhibits normal muscle tone. Coordination normal. GCS eye subscore is 4. GCS verbal subscore is 5. GCS motor subscore is 6.  Cranial nerves III-XII grossly intact Strength 5+/5+ to upper and lower extremities bilaterally with resistance applied, equal distribution Sensation intact  Follows commands appropriately Responds to questions well Negative facial asymmetry,  negative arm drift  Skin: Skin is warm and dry. No rash noted. She is not diaphoretic. No erythema.  Psychiatric: She has a normal mood and affect. Her behavior is normal. Thought content normal.    ED Course  Procedures (including critical care time)  This provider reviewed patient's chart - patient recently had a MRA of the brain that was performed on 10/06/2013 with negative findings. Patient closely followed by Dr. Pearlean Brownie - currently taking Amitriptyline and Zanaflex for control  of headaches.   CLINICAL DATA: 27 year old female with headache. Altered sensation  to touch on the right side of the face, right upper extremity, and  right lower extremity. V2/V3 involvement on the right side.  EXAM:  MRI HEAD WITHOUT AND WITH CONTRAST  MRA HEAD WITHOUT CONTRAST  TECHNIQUE:  Multiplanar, multiecho pulse sequences of the brain and surrounding  structures were obtained without and with intravenous contrast.  Angiographic images of the head were obtained using MRA technique  without contrast.  CONTRAST: 15mL MULTIHANCE GADOBENATE DIMEGLUMINE 529 MG/ML IV SOLN  COMPARISON: None.  FINDINGS:  MRI HEAD FINDINGS  Cerebral volume is normal. No restricted diffusion to suggest acute  infarction. No midline shift, mass effect, evidence of mass lesion,  ventriculomegaly, extra-axial collection or acute intracranial  hemorrhage. Cervicomedullary junction and pituitary are within  normal limits. Negative visualized cervical spine. Major  intracranial vascular flow voids are preserved. Wallace Cullens and white  matter signal is within normal limits throughout the brain.  No abnormal enhancement identified. Normal cavernous sinus.  Bilateral proximal 5th nerve segments appear symmetric and normal.  Visualized orbit soft tissues are within normal limits. Mild right  maxillary sinus mucosal thickening. Other visualized paranasal  sinuses and mastoids are clear. Visualized bone marrow signal is  within normal  limits. Visualized scalp soft tissues are within  normal limits.  MRA HEAD FINDINGS  Antegrade flow in the posterior circulation with codominant distal  vertebral arteries. Normal PICA origins. Normal vertebrobasilar  junction. Normal basilar artery, SCA and PCA origins. Normal  posterior communicating arteries. Normal bilateral PCA branches.  Antegrade flow in both ICA siphons. No ICA stenosis. Ophthalmic and  posterior communicating artery origins are normal. Carotid termini  are patent and within normal limits. Anterior communicating artery  and visualized ACA branches are within normal limits. Visualized  bilateral MCA branches are within normal limits.  IMPRESSION:  MRI HEAD IMPRESSION  Normal MRI appearance of the brain.  MRA HEAD IMPRESSION  Negative intracranial MRA.  Electronically Signed  By: Augusto Gamble M.D.  On: 10/06/2013 19:54  9:38 PM This provider was notified that the patient has been feeling better and is ready to go home. Patient reported that her headache has improved. Heart rate checked manually since mildly elevated - heart rate 86 bpm.   10:04 PM This provider discussed with patient findings. Discussed with patient plan for discharge. Patient agreed to plan of care, understood, all questions answered.   Labs Review Labs Reviewed - No data to display Imaging Review No results found.  EKG Interpretation   None       MDM   1. Migraine    Medications  sodium chloride 0.9 % bolus 1,000 mL (0 mLs Intravenous Stopped 10/31/13 1930)  metoCLOPramide (REGLAN) injection 10 mg (10 mg Intravenous Given 10/31/13 1852)  ketorolac (TORADOL) 30 MG/ML injection 30 mg (30 mg Intravenous Given 10/31/13 1852)  diphenhydrAMINE (BENADRYL) injection 25 mg (25 mg Intravenous Given 10/31/13 1852)  sodium chloride 0.9 % bolus 1,000 mL (0 mLs Intravenous Stopped 10/31/13 2144)  metoCLOPramide (REGLAN) injection 10 mg (10 mg Intravenous Given 10/31/13 2101)   Filed Vitals:    10/31/13 2130 10/31/13 2143 10/31/13 2145 10/31/13 2150  BP: 118/73  116/83   Pulse: 98 86 100   Temp:    97.9 F (36.6 C)  TempSrc:    Oral  Resp:      SpO2: 98%  98%     Patient presenting to the ED with migraines. Patient has a  long history of migraines, is followed by Dr. Pearlean Brownie and is currently taking Zanaflex and Amitriptyline for headache control. Patient presenting to the ED with typical migraine presentations - no new symptoms noted.  MRI and MRA of the brain was performed on 10/06/2013 with negative findings.  Alert and oriented. Patient found laying in bed with the lights off when this provider walked in, tearful upon exam. Mild tachycardia auscultated. Lungs clear to auscultation bilaterally. Pulses palpable and strong. Full ROM to upper and lower extremities bilaterally without difficulty. Cranial nerves grossly intact. GCS 15. Negative neurological deficits noted.  Patient placed on IV fluids and IV medication for migraine relief. Heart rate and blood pressure monitor - was a little lite in the ED setting.  Patient responded well to the medication. Patient sitting upright in bed ready to go home. Patient presenting to the ED with typical migraine presentation - no new symptoms. Doubt ICH. Doubt SAH. Patient stable, afebrile. Discharged patient. Referred patient to PCP and neurologist. Discussed with patient to rest and stay hydrated. Discussed with patient to continue to monitor symptoms and if symptoms are to worsen or change to report back to the ED - strict return instructions given.  Patient agreed to plan of care, understood, all questions answered.     Raymon Mutton, PA-C 11/01/13 1441

## 2013-11-01 ENCOUNTER — Ambulatory Visit: Payer: BC Managed Care – PPO | Admitting: Neurology

## 2013-11-02 ENCOUNTER — Telehealth: Payer: Self-pay | Admitting: Neurology

## 2013-11-02 NOTE — Telephone Encounter (Signed)
called patient to remind of appt 11/04/13 with Imaging,could'nt reach patient,mail box is not set up yet

## 2013-11-04 ENCOUNTER — Other Ambulatory Visit: Payer: BC Managed Care – PPO

## 2013-11-08 NOTE — ED Provider Notes (Signed)
Medical screening examination/treatment/procedure(s) were performed by non-physician practitioner and as supervising physician I was immediately available for consultation/collaboration.  EKG Interpretation   None         Maniyah Moller J Naoma Boxell, MD 11/08/13 0731 

## 2013-11-11 ENCOUNTER — Telehealth: Payer: Self-pay | Admitting: Neurology

## 2013-11-12 ENCOUNTER — Other Ambulatory Visit: Payer: Self-pay | Admitting: *Deleted

## 2013-11-12 NOTE — Telephone Encounter (Signed)
Patient last seen [name], she advised the patient consult with her GYN.

## 2013-12-10 ENCOUNTER — Emergency Department: Payer: Self-pay | Admitting: Emergency Medicine

## 2013-12-10 LAB — URINALYSIS, COMPLETE
Glucose,UR: NEGATIVE mg/dL (ref 0–75)
Hyaline Cast: 2
Ketone: NEGATIVE
Ph: 5 (ref 4.5–8.0)
Squamous Epithelial: 11
WBC UR: 7 /HPF (ref 0–5)

## 2013-12-10 LAB — CBC
HCT: 40.9 % (ref 35.0–47.0)
HGB: 13.5 g/dL (ref 12.0–16.0)
MCH: 27.4 pg (ref 26.0–34.0)
MCHC: 33 g/dL (ref 32.0–36.0)
WBC: 13.9 10*3/uL — ABNORMAL HIGH (ref 3.6–11.0)

## 2013-12-10 LAB — COMPREHENSIVE METABOLIC PANEL
Albumin: 4 g/dL (ref 3.4–5.0)
Anion Gap: 4 — ABNORMAL LOW (ref 7–16)
BUN: 6 mg/dL — ABNORMAL LOW (ref 7–18)
Calcium, Total: 8.4 mg/dL — ABNORMAL LOW (ref 8.5–10.1)
Chloride: 107 mmol/L (ref 98–107)
Co2: 27 mmol/L (ref 21–32)
Creatinine: 0.8 mg/dL (ref 0.60–1.30)
EGFR (African American): >60
EGFR (Non-African Amer.): >60
Osmolality: 273 (ref 275–301)
Potassium: 3.8 mmol/L (ref 3.5–5.1)
SGOT(AST): 41 U/L — ABNORMAL HIGH (ref 15–37)

## 2013-12-10 LAB — LIPASE, BLOOD: Lipase: 168 U/L (ref 73–393)

## 2013-12-13 ENCOUNTER — Ambulatory Visit: Payer: Self-pay | Admitting: Obstetrics and Gynecology

## 2013-12-13 LAB — BASIC METABOLIC PANEL
Anion Gap: 5 — ABNORMAL LOW (ref 7–16)
BUN: 9 mg/dL (ref 7–18)
Calcium, Total: 8.8 mg/dL (ref 8.5–10.1)
Chloride: 106 mmol/L (ref 98–107)
Creatinine: 0.72 mg/dL (ref 0.60–1.30)
EGFR (Non-African Amer.): >60
Glucose: 82 mg/dL (ref 65–99)
Osmolality: 273 (ref 275–301)
Potassium: 3.6 mmol/L (ref 3.5–5.1)
Sodium: 138 mmol/L (ref 136–145)

## 2013-12-13 LAB — CBC
MCH: 27.6 pg (ref 26.0–34.0)
MCHC: 33.4 g/dL (ref 32.0–36.0)
MCV: 83 fL (ref 80–100)
Platelet: 276 10*3/uL (ref 150–440)
RDW: 14.8 % — ABNORMAL HIGH (ref 11.5–14.5)
WBC: 4.6 10*3/uL (ref 3.6–11.0)

## 2013-12-20 ENCOUNTER — Observation Stay: Payer: Self-pay | Admitting: Obstetrics & Gynecology

## 2013-12-20 LAB — URINALYSIS, COMPLETE
Bacteria: NONE SEEN
Bilirubin,UR: NEGATIVE
Glucose,UR: NEGATIVE mg/dL (ref 0–75)
Ketone: NEGATIVE
Nitrite: NEGATIVE
Ph: 7 (ref 4.5–8.0)
Protein: NEGATIVE
RBC,UR: 1 /HPF (ref 0–5)
Specific Gravity: 1.019 (ref 1.003–1.030)
WBC UR: <1 /HPF (ref 0–5)

## 2013-12-20 LAB — COMPREHENSIVE METABOLIC PANEL
Albumin: 3.8 g/dL (ref 3.4–5.0)
Anion Gap: 5 — ABNORMAL LOW (ref 7–16)
Bilirubin,Total: 1.1 mg/dL — ABNORMAL HIGH (ref 0.2–1.0)
Calcium, Total: 8.6 mg/dL (ref 8.5–10.1)
Co2: 27 mmol/L (ref 21–32)
EGFR (African American): >60
EGFR (Non-African Amer.): >60
Glucose: 107 mg/dL — ABNORMAL HIGH (ref 65–99)
Potassium: 3.7 mmol/L (ref 3.5–5.1)
Total Protein: 7.1 g/dL (ref 6.4–8.2)

## 2013-12-20 LAB — LIPASE, BLOOD: Lipase: 584 U/L — ABNORMAL HIGH (ref 73–393)

## 2013-12-20 LAB — CBC
HGB: 13 g/dL (ref 12.0–16.0)
MCH: 26.6 pg (ref 26.0–34.0)
MCHC: 32.4 g/dL (ref 32.0–36.0)
RBC: 4.9 10*6/uL (ref 3.80–5.20)
RDW: 14.7 % — ABNORMAL HIGH (ref 11.5–14.5)

## 2013-12-20 LAB — WET PREP, GENITAL

## 2013-12-21 LAB — CBC WITH DIFFERENTIAL/PLATELET
Basophil #: 0 10*3/uL (ref 0.0–0.1)
Basophil %: 0.2 %
Eosinophil #: 0.1 10*3/uL (ref 0.0–0.7)
Eosinophil %: 0.6 %
HCT: 35.4 % (ref 35.0–47.0)
Lymphocyte #: 0.9 10*3/uL — ABNORMAL LOW (ref 1.0–3.6)
Lymphocyte %: 10.3 %
MCH: 27.9 pg (ref 26.0–34.0)
MCHC: 33.6 g/dL (ref 32.0–36.0)
MCV: 83 fL (ref 80–100)
Monocyte #: 0.5 x10 3/mm (ref 0.2–0.9)
Monocyte %: 6.4 %
Neutrophil #: 6.8 10*3/uL — ABNORMAL HIGH (ref 1.4–6.5)
Neutrophil %: 82.5 %
RBC: 4.26 10*6/uL (ref 3.80–5.20)

## 2013-12-24 ENCOUNTER — Other Ambulatory Visit: Payer: Self-pay

## 2013-12-24 MED ORDER — TIZANIDINE HCL 2 MG PO TABS: 2.0000 mg | ORAL_TABLET | Freq: Three times a day (TID) | ORAL | Status: DC | PRN

## 2013-12-27 LAB — PATHOLOGY REPORT

## 2014-01-17 ENCOUNTER — Telehealth: Payer: Self-pay | Admitting: Nurse Practitioner

## 2014-01-17 ENCOUNTER — Ambulatory Visit: Payer: BC Managed Care – PPO | Admitting: Nurse Practitioner

## 2014-01-17 NOTE — Telephone Encounter (Signed)
Patient was no show for office appointment today. 

## 2014-01-26 ENCOUNTER — Ambulatory Visit: Payer: Self-pay | Admitting: Nurse Practitioner

## 2014-02-01 ENCOUNTER — Emergency Department (HOSPITAL_COMMUNITY)
Admission: EM | Admit: 2014-02-01 | Discharge: 2014-02-01 | Disposition: A | Discharge status: Home / Self Care | Payer: BC Managed Care – PPO | Attending: Emergency Medicine | Admitting: Emergency Medicine

## 2014-02-01 ENCOUNTER — Encounter (HOSPITAL_COMMUNITY): Payer: Self-pay | Admitting: Emergency Medicine

## 2014-02-01 ENCOUNTER — Telehealth: Payer: Self-pay | Admitting: Neurology

## 2014-02-01 DIAGNOSIS — Z91040 Latex allergy status: Secondary | ICD-10-CM | POA: Insufficient documentation

## 2014-02-01 DIAGNOSIS — G43909 Migraine, unspecified, not intractable, without status migrainosus: Secondary | ICD-10-CM | POA: Insufficient documentation

## 2014-02-01 DIAGNOSIS — R42 Dizziness and giddiness: Secondary | ICD-10-CM | POA: Insufficient documentation

## 2014-02-01 DIAGNOSIS — R209 Unspecified disturbances of skin sensation: Secondary | ICD-10-CM | POA: Insufficient documentation

## 2014-02-01 DIAGNOSIS — Z87891 Personal history of nicotine dependence: Secondary | ICD-10-CM | POA: Insufficient documentation

## 2014-02-01 MED ORDER — KETOROLAC TROMETHAMINE 30 MG/ML IJ SOLN
30.0000 mg | Freq: Once | INTRAMUSCULAR | Status: AC
Start: 2014-02-01 — End: 2014-02-01
  Administered 2014-02-01: 30 mg via INTRAVENOUS
  Filled 2014-02-01: qty 1

## 2014-02-01 MED ORDER — DIPHENHYDRAMINE HCL 50 MG/ML IJ SOLN
25.0000 mg | Freq: Once | INTRAMUSCULAR | Status: AC
  Administered 2014-02-01: 25 mg via INTRAVENOUS
  Filled 2014-02-01: qty 1

## 2014-02-01 MED ORDER — SODIUM CHLORIDE 0.9 % IV BOLUS (SEPSIS)
1000.0000 mL | Freq: Once | INTRAVENOUS | Status: AC
  Administered 2014-02-01: 1000 mL via INTRAVENOUS

## 2014-02-01 MED ORDER — METOCLOPRAMIDE HCL 5 MG/ML IJ SOLN
10.0000 mg | Freq: Once | INTRAMUSCULAR | Status: AC
  Administered 2014-02-01: 10 mg via INTRAVENOUS
  Filled 2014-02-01: qty 2

## 2014-02-01 MED ORDER — HYDROMORPHONE HCL PF 1 MG/ML IJ SOLN
1.0000 mg | Freq: Once | INTRAMUSCULAR | Status: AC
  Administered 2014-02-01: 1 mg via INTRAVENOUS
  Filled 2014-02-01: qty 1

## 2014-02-01 MED ORDER — PROMETHAZINE HCL 25 MG/ML IJ SOLN
12.5000 mg | Freq: Once | INTRAMUSCULAR | Status: AC
  Administered 2014-02-01: 12.5 mg via INTRAVENOUS
  Filled 2014-02-01: qty 1

## 2014-02-01 NOTE — ED Notes (Signed)
Pt reports migraine h/a, nausea x 3 days. Reports she has taken benadryl, 800 mg motrin and zofran with no relief. Pt is tearful in triage. States usually needs shot of phenergan to get her to sleep. Pt is ambulatory. A x 4

## 2014-02-01 NOTE — Discharge Instructions (Signed)
Please follow up with Dr. Pearlean BrownieSethi in the office as scheduled. Return if worsening.    Migraine Headache A migraine headache is an intense, throbbing pain on one or both sides of your head. A migraine can last for 30 minutes to several hours. CAUSES  The exact cause of a migraine headache is not always known. However, a migraine may be caused when nerves in the brain become irritated and release chemicals that cause inflammation. This causes pain. Certain things may also trigger migraines, such as:  Alcohol.  Smoking.  Stress.  Menstruation.  Aged cheeses.  Foods or drinks that contain nitrates, glutamate, aspartame, or tyramine.  Lack of sleep.  Chocolate.  Caffeine.  Hunger.  Physical exertion.  Fatigue.  Medicines used to treat chest pain (nitroglycerine), birth control pills, estrogen, and some blood pressure medicines. SIGNS AND SYMPTOMS  Pain on one or both sides of your head.  Pulsating or throbbing pain.  Severe pain that prevents daily activities.  Pain that is aggravated by any physical activity.  Nausea, vomiting, or both.  Dizziness.  Pain with exposure to bright lights, loud noises, or activity.  General sensitivity to bright lights, loud noises, or smells. Before you get a migraine, you may get warning signs that a migraine is coming (aura). An aura may include:  Seeing flashing lights.  Seeing bright spots, halos, or zig-zag lines.  Having tunnel vision or blurred vision.  Having feelings of numbness or tingling.  Having trouble talking.  Having muscle weakness. DIAGNOSIS  A migraine headache is often diagnosed based on:  Symptoms.  Physical exam.  A CT scan or MRI of your head. These imaging tests cannot diagnose migraines, but they can help rule out other causes of headaches. TREATMENT Medicines may be given for pain and nausea. Medicines can also be given to help prevent recurrent migraines.  HOME CARE INSTRUCTIONS  Only take  over-the-counter or prescription medicines for pain or discomfort as directed by your health care provider. The use of long-term narcotics is not recommended.  Lie down in a dark, quiet room when you have a migraine.  Keep a journal to find out what may trigger your migraine headaches. For example, write down:  What you eat and drink.  How much sleep you get.  Any change to your diet or medicines.  Limit alcohol consumption.  Quit smoking if you smoke.  Get 7 9 hours of sleep, or as recommended by your health care provider.  Limit stress.  Keep lights dim if bright lights bother you and make your migraines worse. SEEK IMMEDIATE MEDICAL CARE IF:   Your migraine becomes severe.  You have a fever.  You have a stiff neck.  You have vision loss.  You have muscular weakness or loss of muscle control.  You start losing your balance or have trouble walking.  You feel faint or pass out.  You have severe symptoms that are different from your first symptoms. MAKE SURE YOU:   Understand these instructions.  Will watch your condition.  Will get help right away if you are not doing well or get worse. Document Released: 12/16/2005 Document Revised: 10/06/2013 Document Reviewed: 08/23/2013 [facility] Patient Information 2014 [employer] [place].

## 2014-02-01 NOTE — ED Provider Notes (Signed)
CSN: [id number]     Arrival date & time 02/01/14  1505 History   First MD Initiated Contact with Patient 02/01/14 1549     Chief Complaint  Patient presents with  . Migraine   (Consider location/radiation/quality/duration/timing/severity/associated sxs/prior Treatment) HPI [name] is a 28 y.o. female who presents to emergency department complaining of a migraine headache. She states that her pain began 3 days ago. States on the right side, states feels like her right face is numb. She states she has taking ibuprofen, Zofran, Benadryl with no relief. She states this morning she began to have vomiting and unable to keep anything down. She does have history of migraines. She states she sees Dr. Pearlean BrownieSethi, last seen 3 months ago. States she called his office today and got an appointment for 3 days from today. Patient states that her migraine is typical of her regular migraines. She denies any visual changes. She denies any photophobia. She denies any fevers. She denies any neck pain or rigidity. No other neuro deficits.     Past Medical History  Diagnosis Date  . Migraine     hemiplegic  . Migraine    Past Surgical History  Procedure Laterality Date  . Cholecystectomy  2013  . Dental surgery     Family History  Problem Relation Age of Onset  . Cancer - Other Mother 3056  . Hypertension Mother   . Hypertension Father   . Breast cancer Maternal Grandmother    History  Substance Use Topics  . Smoking status: Former Smoker -- 1.00 packs/day for 5 years  . Smokeless tobacco: Never Used  . Alcohol Use: Yes     Comment: 4/week   OB History   Grav Para Term Preterm Abortions TAB SAB Ect Mult Living   3         3     Obstetric Comments   Age first pregnancy 18     Review of Systems  Constitutional: Negative for fever and chills.  Respiratory: Negative for cough, chest tightness and shortness of breath.   Cardiovascular: Negative for chest pain, palpitations and leg swelling.   Gastrointestinal: Positive for nausea and vomiting. Negative for abdominal pain.  Genitourinary: Negative for dysuria, flank pain and pelvic pain.  Musculoskeletal: Negative for arthralgias, myalgias, neck pain and neck stiffness.  Skin: Negative for rash.  Neurological: Positive for dizziness, numbness and headaches. Negative for weakness.  All other systems reviewed and are negative.    Allergies  Latex  Home Medications   Current Outpatient Rx  Name  Route  Sig  Dispense  Refill  . amitriptyline (ELAVIL) 25 MG tablet   Oral   Take 50 mg by mouth at bedtime.         . diphenhydrAMINE (BENADRYL) 25 MG tablet   Oral   Take 25 mg by mouth every 6 (six) hours as needed.         Marland Kitchen. ibuprofen (ADVIL,MOTRIN) 800 MG tablet   Oral   Take 800 mg by mouth every 8 (eight) hours as needed for headache.         Marland Kitchen. tiZANidine (ZANAFLEX) 2 MG tablet   Oral   Take 1 tablet (2 mg total) by mouth 3 (three) times daily as needed (pain).   90 tablet   1   . zolpidem (AMBIEN) 10 MG tablet   Oral   Take 10 mg by mouth at bedtime as needed for sleep.  BP 122/96  Pulse 116  Temp(Src) 98 F (36.7 C)  Resp 20  SpO2 96%  LMP 01/11/2014 Physical Exam  Nursing note and vitals reviewed. Constitutional: She is oriented to person, place, and time. She appears well-developed and well-nourished. No distress.  HENT:  Head: Normocephalic.  Eyes: Conjunctivae and EOM are normal. Pupils are equal, round, and reactive to light.  Neck: Normal range of motion. Neck supple.  Cardiovascular: Normal rate, regular rhythm and normal heart sounds.   Pulmonary/Chest: Effort normal and breath sounds normal. No respiratory distress. She has no wheezes. She has no rales.  Abdominal: Soft. Bowel sounds are normal. She exhibits no distension. There is no tenderness. There is no rebound.  Musculoskeletal: She exhibits no edema.  Neurological: She is alert and oriented to person, place, and time.   5/5 and equal upper and lower extremity strength bilaterally. Equal grip strength bilaterally. Normal finger to nose and heel to shin. No pronator drift.   Skin: Skin is warm and dry.  Psychiatric: She has a normal mood and affect. Her behavior is normal.    ED Course  Procedures (including critical care time) Labs Review Labs Reviewed - No data to display Imaging Review No results found.  EKG Interpretation   None       MDM   1. Migraine     Pt with typical for her migraine, no aura, left facial numbness. Pt is crying in the room. Requesting IV medications. IV started will order Migraine cocktail. No further labs or imaging indicated. No trauma, afebrile, no nuchal rigidity.   7:02 PM Pt is not improved at all afte toradol, phenergan, reglan, benadryl. States her headache is not even mildly improved. States she needs "something for pain." Pt specifically asking for narcotic pain medication, and states it usually "helps me go to sleep and then my headache goes away."  Given 1mg  of Dilaudid.   7:38 PM Pt feeling better. Ready for discharge.   Filed Vitals:   02/01/14 1730 02/01/14 1800 02/01/14 1830 02/01/14 1835  BP: 123/69 116/82 113/78 113/78  Pulse: 115 115 115 113  Temp:    98.6 F (37 C)  TempSrc:    Oral  Resp:    20  SpO2: 100% 99% 100% 100%     Lottie Mussel, PA-C 02/01/14 2158

## 2014-02-01 NOTE — Telephone Encounter (Signed)
Pt called and stated that she has been fighting a migraine for 3-4 days.  She says she has been throwing up and that she has pressure behind her right eye and the right side of her face is numb.  She asked if it would be possible to get an appointment with Dr. Pearlean BrownieSethi or the nurse practitioner.  Please call.

## 2014-02-01 NOTE — ED Provider Notes (Signed)
Medical screening examination/treatment/procedure(s) were performed by non-physician practitioner and as supervising physician I was immediately available for consultation/collaboration.  Megan E Docherty, MD 02/01/14 2305 

## 2014-02-01 NOTE — Telephone Encounter (Signed)
Okay to work in to see next Facilities manageravailable nurse practitioner/M.D.

## 2014-02-02 NOTE — Telephone Encounter (Signed)
Called patient to schedule appt, no answer, vmail has not been set up on phone yet, could not leave message

## 2014-02-04 ENCOUNTER — Ambulatory Visit (INDEPENDENT_AMBULATORY_CARE_PROVIDER_SITE_OTHER): Payer: BC Managed Care – PPO | Admitting: *Deleted

## 2014-02-04 ENCOUNTER — Encounter: Payer: Self-pay | Admitting: Nurse Practitioner

## 2014-02-04 ENCOUNTER — Ambulatory Visit (INDEPENDENT_AMBULATORY_CARE_PROVIDER_SITE_OTHER): Payer: BC Managed Care – PPO | Admitting: Nurse Practitioner

## 2014-02-04 VITALS — BP 112/78 | HR 104 | Ht 64.0 in | Wt 172.0 lb

## 2014-02-04 DIAGNOSIS — G43909 Migraine, unspecified, not intractable, without status migrainosus: Secondary | ICD-10-CM

## 2014-02-04 DIAGNOSIS — R112 Nausea with vomiting, unspecified: Secondary | ICD-10-CM

## 2014-02-04 MED ORDER — PROMETHAZINE HCL 25 MG/ML IJ SOLN
50.0000 mg | Freq: Once | INTRAMUSCULAR | Status: AC
  Administered 2014-02-04: 50 mg via INTRAMUSCULAR

## 2014-02-04 MED ORDER — AMITRIPTYLINE HCL 25 MG PO TABS: 50.0000 mg | ORAL_TABLET | Freq: Every day | ORAL | Status: DC

## 2014-02-04 MED ORDER — PROMETHAZINE HCL 50 MG/ML IJ SOLN: 50.0000 mg | Freq: Four times a day (QID) | INTRAMUSCULAR | Status: DC | PRN

## 2014-02-04 MED ORDER — KETOROLAC TROMETHAMINE 30 MG/ML IJ SOLN: 30.0000 mg | Freq: Once | INTRAMUSCULAR | Status: DC

## 2014-02-04 MED ORDER — TRAZODONE HCL 50 MG PO TABS: 50.0000 mg | ORAL_TABLET | Freq: Every day | ORAL | Status: DC

## 2014-02-04 NOTE — Patient Instructions (Signed)
Pt to go home and sleep, mother driving her home.

## 2014-02-04 NOTE — Progress Notes (Signed)
PATIENT: [name] DOB: [birth year]   REASON FOR VISIT: follow up for Migraine HISTORY FROM: patient  HISTORY OF PRESENT ILLNESS: [age] year Caucasian lady with migraine headaches since 6 th grade in school off and on which have increased in last 5 months to 1-2 per week.These are severe throbbing periorbital and occipital, present upon awakening and last 1-2 days.there is nausea, vomitting and worsening with activity and relief with sleep. 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 She is currently on Topamax 400 mg without clear efficacy.She has not tried elavil or zanaflex for prophylaxis.she was [facility] with several days of headache, dehydration.She has had brain imaging studies in past but my review [facility] and our office notes do not mention any reference to these.She takes toradol 60 mg and phenergan 25 mg im self injections for symptomatic relief 1-2 times per month   UPDATE 09/21/13 (LL): Patient comes in for acute visit, having severe Migraine for 3 days. Last ER visit was 09/16/13 in which she received headache Toradol, Zofran, Morphine, Decadron, and 1,000 mL NS bolus. She states she cannot even keep water down due to nausea and vomiting. She reports blurred vision and black spots in right eye and tingling on right side of face, arm and leg up to knee. She has not had MRI/MRA done yet.   UPDATE 10/25/13 (PS): She is seen urgently today as she called complaining  of persistent severe headaches. She has gone to the emergency room several times and states is having 2-3 bad headaches a week now. She does not find relief with the current medications and has gone to the ER and received diluted injections. She did not take some fracture on a daily basis as I had instructed but instead took it on an as needed basis couple of doses and did not have it effective and stopped. She continues to take amitriptyline 50 mg at night. She underwent MRI scan of the brain and MRA of the brain on 10[facility]06/13 [facility] which were both normal. 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 The numbness lasts a couple of days where as the vision improves. She finds relief only by going to sleep.   UPDATE 02/04/14 (LL):  [name] returns for follow up.  Her Migraines have become much less frequent over the last 3 months.  She has weaned herself off many medications that were prescribed by a psychiatrist she was seeing.  She has had about a 50 lb. Weight gain in the past 6 months, she attributes to Mirtazapine.  She has had one ER visit for intractable migraine on 2/3, the headache had lasted 3 days with nausea and vomiting. She states that if she can catch Migraine in early stage and take 3 benadryl tablets, the headache will stop.  She has been having fever, chills, nausea and vomiting without headache or diarrhea since yesterday, but did not want to miss today's appointment.  REVIEW OF SYSTEMS: Full 14 system review of systems performed and notable only for: weight gain, headache, nausea and vomiting    ALLERGIES: Allergies  Allergen Reactions  . Latex Rash    HOME MEDICATIONS: Outpatient Prescriptions Prior to Visit  Medication Sig Dispense Refill  . diphenhydrAMINE (BENADRYL) 25 MG tablet Take 25 mg by mouth every 6 (six) hours as needed.      Marland Kitchen ibuprofen (ADVIL,MOTRIN) 800 MG tablet Take 800 mg by mouth every 8 (eight)  hours as needed for headache.      Marland Kitchen tiZANidine (ZANAFLEX) 2 MG tablet Take 1 tablet (2 mg total) by mouth 3 (three) times daily as needed (pain).  90 tablet  1  . zolpidem (AMBIEN) 10 MG tablet Take 10 mg by mouth at bedtime as needed for sleep.       Marland Kitchen amitriptyline (ELAVIL) 25 MG tablet Take 50 mg by mouth at bedtime.       Facility-Administered Medications Prior to Visit  Medication Dose Route Frequency Provider Last Rate Last Dose  . prochlorperazine (COMPAZINE) injection 10 mg  10 mg Intravenous Once Ronal Fear, NP      . valproate (DEPACON) 1,000 mg in sodium chloride 0.9 % 100 mL IVPB  1,000 mg Intravenous Continuous Ronal Fear, NP 110 mL/hr at 09/21/13 1750 1,000 mg at 09/21/13 1750    PAST MEDICAL HISTORY: Past Medical History  Diagnosis Date  . Migraine     hemiplegic  . Migraine     PAST SURGICAL HISTORY: Past Surgical History  Procedure Laterality Date  . Cholecystectomy  2013  . Dental surgery      FAMILY HISTORY: Family History  Problem Relation Age of Onset  . Cancer - Other Mother 58  . Hypertension Mother   . Hypertension Father   . Breast cancer Maternal Grandmother     SOCIAL HISTORY: History   Social History  . Marital Status: Married    Spouse Name: dan    Number of Children: 3  . Years of Education: college   Occupational History  . N/A    Social History Main Topics  . Smoking status: Former Smoker -- 1.00 packs/day for 5 years  . Smokeless tobacco: Never Used  . Alcohol Use: Yes     Comment: 4/week  . Drug Use: No  . Sexual Activity: No   Other Topics Concern  . Not on file   Social History Narrative  . No narrative on file     PHYSICAL EXAM  Filed Vitals:   02/04/14 1415  BP: 112/78  Pulse: 104  Height: 5\' 4"  (1.626 m)  Weight: 172 lb (78.019 kg)   Body mass index is 29.51 kg/(m^2).  Generalized: Well developed, in no acute distress  Head: normocephalic and atraumatic. Oropharynx benign  Neck: Supple, no carotid  bruits  Cardiac: Regular rate rhythm, no murmur  Musculoskeletal: No deformity   Neurological examination  Mentation: Alert oriented to time, place, history taking. Follows all commands speech and language fluent Cranial nerve II-XII:  Pupils were equal round reactive to light extraocular movements were full, visual field were full on confrontational test. Facial sensation and strength were normal. hearing was intact to finger rubbing bilaterally. Uvula tongue midline. head turning and shoulder shrug and were normal and symmetric.Tongue protrusion into cheek strength was normal. Motor: normal bulk and tone, full strength in the BUE, BLE, fine finger movements normal, no pronator drift. No focal weakness Sensory: normal and symmetric to light touch, pinprick,  and  vibration  Coordination: finger-nose-finger, heel-to-shin bilaterally, no dysmetria Reflexes:  Deep tendon reflexes in the upper and lower extremities are present and symmetric.  Gait and Station: Rising up from seated position without assistance, normal stance, without trunk ataxia, moderate stride, good arm swing, smooth turning, able to perform tiptoe, and heel walking without difficulty.   ASSESSMENT AND PLAN 28 y.o. 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   PLAN:  Start Trazodone, 1/2 tablet at bedtime for 1 week. Then Increase to 1 whole tablet at bedtime. Change zanaflex to 2 mh hs daily and increase as tolerated.  Continue amitrptylline 50 mg hs for prophylaxis and toradol 30/phenergan 50 injections for rescue symptomatic relief.  Follow up in 3 months, sooner as needed.  Meds ordered this encounter  Medications  . ketorolac (TORADOL) 30 MG/ML injection    Sig: Inject 1 mL (30 mg total) into the vein once.    Dispense:  10 mL    Refill:  5    1 box of 10 vials please for home injections.    Order Specific Question:  Supervising Provider    Answer:  Micki Riley [[id number]]  . promethazine (PHENERGAN) 50 MG/ML injection    Sig: Inject 1 mL (50 mg total) into the muscle every 6 (six) hours as needed for nausea or vomiting.    Dispense:  20 mL    Refill:  5    Please match syringe count to number of vials.    Order Specific Question:  Supervising Provider    Answer:  Micki Riley [[id number]]  . traZODone (DESYREL) 50 MG tablet    Sig: Take 1 tablet (50 mg total) by mouth at bedtime.    Dispense:  30 tablet    Refill:  5    Order Specific Question:  Supervising Provider    Answer:  Pearlean Brownie, PRAMOD S [[id number]]  . amitriptyline (ELAVIL) 25 MG tablet    Sig: Take 2 tablets (50 mg total) by mouth at bedtime.    Dispense:  60 tablet    Refill:  5    Order Specific Question:  Supervising Provider    Answer:  Micki Riley [[id number]]   Return in about 3 months (around 05/04/2014).  Ronal Fear, MSN, NP-C 02/05/2014, 10:17 AM [facility] [address], [address] [place], [place] [address] [phone]  Note: This document was prepared with digital dictation and possible smart phrase technology. Any transcriptional errors that result from this process are unintentional.

## 2014-02-04 NOTE — Progress Notes (Signed)
Pt here seeing Heide GuileLynn Lam, NP , ordered Phenergan 50mg  IM.  Split into 2 injections (25mg  phenergan w/ 0.645ml 0.9%NS), Given R and L gluteal.  Noted R sided knots at injection site from previous injections.  Bandaids applied.  Pt with mother and daughter.

## 2014-02-04 NOTE — Patient Instructions (Addendum)
Start Trazodone , 1/2 tablet at bedtime for 1 week. Then Increase to 1 whole tablet at bedtime.  Continue Amitriptyline, refills sent.  I have sent an Rx for 1 box of Toradol, 30 mg/mL and  Phenergan 50 mg/721ml injections, 20 count with syringes to your pharmacy.  Follow up in 3 months, sooner as needed.

## 2014-02-05 ENCOUNTER — Encounter (HOSPITAL_COMMUNITY): Payer: Self-pay | Admitting: Emergency Medicine

## 2014-02-05 ENCOUNTER — Emergency Department (HOSPITAL_COMMUNITY)
Admission: EM | Admit: 2014-02-05 | Discharge: 2014-02-05 | Disposition: A | Discharge status: Home / Self Care | Payer: BC Managed Care – PPO | Attending: Emergency Medicine | Admitting: Emergency Medicine

## 2014-02-05 ENCOUNTER — Emergency Department: Payer: Self-pay | Admitting: Internal Medicine

## 2014-02-05 ENCOUNTER — Emergency Department (HOSPITAL_COMMUNITY): Payer: BC Managed Care – PPO

## 2014-02-05 DIAGNOSIS — Z87891 Personal history of nicotine dependence: Secondary | ICD-10-CM | POA: Insufficient documentation

## 2014-02-05 DIAGNOSIS — M545 Low back pain, unspecified: Secondary | ICD-10-CM | POA: Insufficient documentation

## 2014-02-05 DIAGNOSIS — Z3202 Encounter for pregnancy test, result negative: Secondary | ICD-10-CM | POA: Insufficient documentation

## 2014-02-05 DIAGNOSIS — R1032 Left lower quadrant pain: Secondary | ICD-10-CM | POA: Insufficient documentation

## 2014-02-05 DIAGNOSIS — Z9851 Tubal ligation status: Secondary | ICD-10-CM | POA: Insufficient documentation

## 2014-02-05 DIAGNOSIS — Z91040 Latex allergy status: Secondary | ICD-10-CM | POA: Insufficient documentation

## 2014-02-05 DIAGNOSIS — R630 Anorexia: Secondary | ICD-10-CM | POA: Insufficient documentation

## 2014-02-05 DIAGNOSIS — Z79899 Other long term (current) drug therapy: Secondary | ICD-10-CM | POA: Insufficient documentation

## 2014-02-05 DIAGNOSIS — Z792 Long term (current) use of antibiotics: Secondary | ICD-10-CM | POA: Insufficient documentation

## 2014-02-05 DIAGNOSIS — R112 Nausea with vomiting, unspecified: Secondary | ICD-10-CM | POA: Insufficient documentation

## 2014-02-05 DIAGNOSIS — R82998 Other abnormal findings in urine: Secondary | ICD-10-CM | POA: Insufficient documentation

## 2014-02-05 DIAGNOSIS — R8281 Pyuria: Secondary | ICD-10-CM

## 2014-02-05 DIAGNOSIS — G43909 Migraine, unspecified, not intractable, without status migrainosus: Secondary | ICD-10-CM | POA: Insufficient documentation

## 2014-02-05 DIAGNOSIS — R509 Fever, unspecified: Secondary | ICD-10-CM | POA: Insufficient documentation

## 2014-02-05 DIAGNOSIS — R109 Unspecified abdominal pain: Secondary | ICD-10-CM

## 2014-02-05 DIAGNOSIS — Z9089 Acquired absence of other organs: Secondary | ICD-10-CM | POA: Insufficient documentation

## 2014-02-05 LAB — WET PREP, GENITAL
Clue Cells Wet Prep HPF POC: NONE SEEN
TRICH WET PREP: NONE SEEN
Yeast Wet Prep HPF POC: NONE SEEN

## 2014-02-05 LAB — URINALYSIS, COMPLETE
Bilirubin,UR: NEGATIVE
Blood: NEGATIVE
Glucose,UR: NEGATIVE mg/dL (ref 0–75)
KETONE: NEGATIVE
NITRITE: NEGATIVE
Ph: 6 (ref 4.5–8.0)
Protein: NEGATIVE
Specific Gravity: 1.018 (ref 1.003–1.030)

## 2014-02-05 LAB — COMPREHENSIVE METABOLIC PANEL
ALBUMIN: 4.4 g/dL (ref 3.4–5.0)
ALK PHOS: 61 U/L (ref 39–117)
ALT: 25 U/L (ref 12–78)
ALT: 17 U/L (ref 0–35)
ANION GAP: 6 — AB (ref 7–16)
AST: 17 U/L (ref 0–37)
Albumin: 4.2 g/dL (ref 3.5–5.2)
Alkaline Phosphatase: 67 U/L
BILIRUBIN TOTAL: 0.3 mg/dL (ref 0.3–1.2)
BUN: 8 mg/dL (ref 7–18)
BUN: 9 mg/dL (ref 6–23)
Bilirubin,Total: 0.4 mg/dL (ref 0.2–1.0)
CALCIUM: 9.2 mg/dL (ref 8.5–10.1)
CALCIUM: 9.1 mg/dL (ref 8.4–10.5)
CHLORIDE: 101 meq/L (ref 96–112)
CO2: 25 mmol/L (ref 21–32)
CO2: 21 meq/L (ref 19–32)
CREATININE: 0.73 mg/dL (ref 0.60–1.30)
Chloride: 105 mmol/L (ref 98–107)
Creatinine, Ser: 0.72 mg/dL (ref 0.50–1.10)
EGFR (African American): >60
EGFR (Non-African Amer.): >60
GFR calc Af Amer: >90 mL/min (ref >90)
GLUCOSE: 119 mg/dL — AB (ref 70–99)
Glucose: 103 mg/dL — ABNORMAL HIGH (ref 65–99)
Osmolality: 271 (ref 275–301)
Potassium: 4.2 mmol/L (ref 3.5–5.1)
Potassium: 4.1 mEq/L (ref 3.7–5.3)
SGOT(AST): 10 U/L — ABNORMAL LOW (ref 15–37)
SODIUM: 136 mmol/L (ref 136–145)
SODIUM: 139 meq/L (ref 137–147)
Total Protein: 7.9 g/dL (ref 6.4–8.2)
Total Protein: 7.5 g/dL (ref 6.0–8.3)

## 2014-02-05 LAB — URINALYSIS, ROUTINE W REFLEX MICROSCOPIC
Glucose, UA: NEGATIVE mg/dL
HGB URINE DIPSTICK: NEGATIVE
KETONES UR: 15 mg/dL — AB
Nitrite: NEGATIVE
Protein, ur: NEGATIVE mg/dL
SPECIFIC GRAVITY, URINE: 1.029 (ref 1.005–1.030)
UROBILINOGEN UA: 0.2 mg/dL (ref 0.0–1.0)
pH: 6 (ref 5.0–8.0)

## 2014-02-05 LAB — CBC WITH DIFFERENTIAL/PLATELET
Basophil #: 0.1 10*3/uL (ref 0.0–0.1)
Basophil %: 0.7 %
Basophils Absolute: 0 10*3/uL (ref 0.0–0.1)
Basophils Relative: 0 % (ref 0–1)
EOS PCT: 1.1 %
Eosinophil #: 0.1 10*3/uL (ref 0.0–0.7)
Eosinophils Absolute: 0.1 10*3/uL (ref 0.0–0.7)
Eosinophils Relative: 1 % (ref 0–5)
HCT: 41.4 % (ref 35.0–47.0)
HCT: 39 % (ref 36.0–46.0)
HGB: 14.1 g/dL (ref 12.0–16.0)
Hemoglobin: 13.3 g/dL (ref 12.0–15.0)
LYMPHS ABS: 2.3 10*3/uL (ref 1.0–3.6)
LYMPHS ABS: 2.2 10*3/uL (ref 0.7–4.0)
LYMPHS PCT: 23.7 %
LYMPHS PCT: 18 % (ref 12–46)
MCH: 28.4 pg (ref 26.0–34.0)
MCH: 28.5 pg (ref 26.0–34.0)
MCHC: 33.9 g/dL (ref 32.0–36.0)
MCHC: 34.1 g/dL (ref 30.0–36.0)
MCV: 84 fL (ref 80–100)
MCV: 83.7 fL (ref 78.0–100.0)
MONOS PCT: 7 %
Monocyte #: 0.7 x10 3/mm (ref 0.2–0.9)
Monocytes Absolute: 0.6 10*3/uL (ref 0.1–1.0)
Monocytes Relative: 5 % (ref 3–12)
NEUTROS PCT: 67.5 %
Neutro Abs: 8.9 10*3/uL — ABNORMAL HIGH (ref 1.7–7.7)
Neutrophil #: 6.6 10*3/uL — ABNORMAL HIGH (ref 1.4–6.5)
Neutrophils Relative %: 75 % (ref 43–77)
Platelet: 342 10*3/uL (ref 150–440)
Platelets: 351 10*3/uL (ref 150–400)
RBC: 4.95 10*6/uL (ref 3.80–5.20)
RBC: 4.66 MIL/uL (ref 3.87–5.11)
RDW: 16.2 % — ABNORMAL HIGH (ref 11.5–14.5)
RDW: 15.7 % — ABNORMAL HIGH (ref 11.5–15.5)
WBC: 9.9 10*3/uL (ref 3.6–11.0)
WBC: 11.8 10*3/uL — AB (ref 4.0–10.5)

## 2014-02-05 LAB — URINE MICROSCOPIC-ADD ON

## 2014-02-05 LAB — PREGNANCY, URINE: PREG TEST UR: NEGATIVE

## 2014-02-05 MED ORDER — MORPHINE SULFATE 4 MG/ML IJ SOLN
4.0000 mg | Freq: Once | INTRAMUSCULAR | Status: AC
  Administered 2014-02-05: 4 mg via INTRAVENOUS
  Filled 2014-02-05: qty 1

## 2014-02-05 MED ORDER — HYDROCODONE-ACETAMINOPHEN 5-325 MG PO TABS: 1.0000 | ORAL_TABLET | Freq: Four times a day (QID) | ORAL | Status: DC | PRN

## 2014-02-05 MED ORDER — SODIUM CHLORIDE 0.9 % IV SOLN
1000.0000 mL | Freq: Once | INTRAVENOUS | Status: AC
  Administered 2014-02-05: 1000 mL via INTRAVENOUS

## 2014-02-05 MED ORDER — ONDANSETRON 4 MG PO TBDP: 4.0000 mg | ORAL_TABLET | Freq: Three times a day (TID) | ORAL | Status: DC | PRN

## 2014-02-05 MED ORDER — CEPHALEXIN 500 MG PO CAPS: 500.0000 mg | ORAL_CAPSULE | Freq: Four times a day (QID) | ORAL | Status: DC

## 2014-02-05 MED ORDER — ONDANSETRON HCL 4 MG/2ML IJ SOLN
4.0000 mg | Freq: Once | INTRAMUSCULAR | Status: AC
  Administered 2014-02-05: 4 mg via INTRAVENOUS
  Filled 2014-02-05: qty 2

## 2014-02-05 NOTE — ED Provider Notes (Signed)
CSN: [id number]     Arrival date & time 02/05/14  1403 History   First MD Initiated Contact with Patient 02/05/14 1504     Chief Complaint  Patient presents with  . Abdominal Pain   (Consider location/radiation/quality/duration/timing/severity/associated sxs/prior Treatment) HPI Comments: Patient is a G2 P53 28 year old female past medical history significant for migraines presented to the emergency department for 2 days of intermittent sharp, stabbing left lower abdominal pain with radiation to left lower back with associated nausea, non-bloody emesis. Denies any alleviating or aggravating factors. Patient also endorses subjective fevers and chills and decreased PO intake. Abdominal surgical history includes cholecystectomy and tubal ligation. LMP was one week ago.   Patient is a 28 y.o. female presenting with abdominal pain.  Abdominal Pain Associated symptoms: nausea and vomiting   Associated symptoms: no chest pain, no constipation, no diarrhea, no dysuria, no shortness of breath, no vaginal bleeding and no vaginal discharge     Past Medical History  Diagnosis Date  . Migraine     hemiplegic  . Migraine    Past Surgical History  Procedure Laterality Date  . Cholecystectomy  2013  . Dental surgery     Family History  Problem Relation Age of Onset  . Cancer - Other Mother 71  . Hypertension Mother   . Hypertension Father   . Breast cancer Maternal Grandmother    History  Substance Use Topics  . Smoking status: Former Smoker -- 1.00 packs/day for 5 years  . Smokeless tobacco: Never Used  . Alcohol Use: Yes     Comment: 4/week   OB History   Grav Para Term Preterm Abortions TAB SAB Ect Mult Living   3         3     Obstetric Comments   Age first pregnancy 18     Review of Systems  Respiratory: Negative for shortness of breath.   Cardiovascular: Negative for chest pain.  Gastrointestinal: Positive for nausea, vomiting and abdominal pain. Negative for diarrhea,  constipation, blood in stool, abdominal distention and anal bleeding.  Genitourinary: Negative for dysuria, vaginal bleeding, vaginal discharge and vaginal pain.  Musculoskeletal: Positive for back pain.  All other systems reviewed and are negative.    Allergies  Latex  Home Medications   Current Outpatient Rx  Name  Route  Sig  Dispense  Refill  . diphenhydrAMINE (BENADRYL) 25 MG tablet   Oral   Take 25 mg by mouth every 6 (six) hours as needed (migraine).          Marland Kitchen ketorolac (TORADOL) 30 MG/ML injection   Intravenous   Inject 30 mg into the vein once as needed (as needed for migraines).         . promethazine (PHENERGAN) 50 MG/ML injection   Intramuscular   Inject 1 mL (50 mg total) into the muscle every 6 (six) hours as needed for nausea or vomiting.   20 mL   5     Please match syringe count to number of vials.   Marland Kitchen tiZANidine (ZANAFLEX) 2 MG tablet   Oral   Take 4 mg by mouth at bedtime as needed for muscle spasms.         Marland Kitchen amitriptyline (ELAVIL) 25 MG tablet   Oral   Take 2 tablets (50 mg total) by mouth at bedtime.   60 tablet   5   . cephALEXin (KEFLEX) 500 MG capsule   Oral   Take 1 capsule (500 mg total) by  mouth 4 (four) times daily.   40 capsule   0   . HYDROcodone-acetaminophen (NORCO/VICODIN) 5-325 MG per tablet   Oral   Take 1-2 tablets by mouth every 6 (six) hours as needed for severe pain.   10 tablet   0   . ondansetron (ZOFRAN ODT) 4 MG disintegrating tablet   Oral   Take 1 tablet (4 mg total) by mouth every 8 (eight) hours as needed for nausea or vomiting.   10 tablet   0   . traZODone (DESYREL) 50 MG tablet   Oral   Take 1 tablet (50 mg total) by mouth at bedtime.   30 tablet   5    BP 114/66  Pulse 108  Temp(Src) 98 F (36.7 C) (Oral)  Resp 18  Ht 5\' 4"  (1.626 m)  Wt 170 lb (77.111 kg)  BMI 29.17 kg/m2  SpO2 100%  LMP 01/11/2014 Physical Exam  Constitutional: She is oriented to person, place, and time. She  appears well-developed and well-nourished. No distress.  HENT:  Head: Normocephalic and atraumatic.  Right Ear: External ear normal.  Left Ear: External ear normal.  Nose: Nose normal.  Mouth/Throat: Oropharynx is clear and moist. No oropharyngeal exudate.  Eyes: Conjunctivae are normal.  Neck: Normal range of motion. Neck supple.  Cardiovascular: Normal rate, regular rhythm and normal heart sounds.   Pulmonary/Chest: Effort normal and breath sounds normal. No respiratory distress.  Abdominal: Soft. Bowel sounds are normal. She exhibits no distension. There is no hepatosplenomegaly. There is tenderness in the left lower quadrant. There is no rigidity, no rebound, no guarding and no CVA tenderness.    Musculoskeletal: Normal range of motion. She exhibits no edema.       Thoracic back: Normal.       Lumbar back: Normal.  Lymphadenopathy:    She has no cervical adenopathy.  Neurological: She is alert and oriented to person, place, and time.  Skin: Skin is warm and dry. She is not diaphoretic.  Exam performed by Francee PiccoloPIEPENBRINK, Dequavion Follette L,  exam chaperoned Date: 02/05/2014 Pelvic exam: normal external genitalia without evidence of trauma. VULVA: normal appearing vulva with no masses, tenderness or lesion. VAGINA: normal appearing vagina with normal color and discharge, no lesions. CERVIX: normal appearing cervix without lesions, cervical motion tenderness absent, cervical os closed with out purulent discharge; vaginal discharge - creamy white, Wet prep and DNA probe for chlamydia and GC obtained.   ADNEXA: normal adnexa in size, nontender R adnexal, mild tenderness on L adnexal, and no masses UTERUS: uterus is normal size, shape, consistency and nontender.    ED Course  Procedures (including critical care time) Medications  0.9 %  sodium chloride infusion (0 mLs Intravenous Stopped 02/05/14 1716)  ondansetron (ZOFRAN) injection 4 mg (4 mg Intravenous Given 02/05/14 1530)  morphine 4 MG/ML  injection 4 mg (4 mg Intravenous Given 02/05/14 1530)  morphine 4 MG/ML injection 4 mg (4 mg Intravenous Given 02/05/14 1657)  ondansetron (ZOFRAN) injection 4 mg (4 mg Intravenous Given 02/05/14 1710)    Labs Review Labs Reviewed  WET PREP, GENITAL - Abnormal; Notable for the following:    WBC, Wet Prep HPF POC MANY (*)    All other components within normal limits  CBC WITH DIFFERENTIAL - Abnormal; Notable for the following:    WBC 11.8 (*)    RDW 15.7 (*)    Neutro Abs 8.9 (*)    All other components within normal limits  COMPREHENSIVE METABOLIC PANEL - Abnormal;  Notable for the following:    Glucose, Bld 119 (*)    All other components within normal limits  URINALYSIS, ROUTINE W REFLEX MICROSCOPIC - Abnormal; Notable for the following:    Color, Urine AMBER (*)    APPearance CLOUDY (*)    Bilirubin Urine SMALL (*)    Ketones, ur 15 (*)    Leukocytes, UA MODERATE (*)    All other components within normal limits  URINE MICROSCOPIC-ADD ON - Abnormal; Notable for the following:    Squamous Epithelial / LPF MANY (*)    Bacteria, UA MANY (*)    All other components within normal limits  GC/CHLAMYDIA PROBE AMP  URINE CULTURE  PREGNANCY, URINE   Imaging Review US Transvaginal Non-ob  02/05/2014   CLINICAL DATA:  Left-sided pelvic pain for 2 days. Rule out torsion.  EXAM: TRANSABDOMINAL AND TRANSVAGINAL ULTRASOUND OF PELVIS  DOPPLER ULTRASOUND OF OVARIES  TECHNIQUE: Both transabdominal and transvaginal ultrasound examinations of the pelvis were performed. Transabdominal technique was performed for global imaging of the pelvis including uterus, ovaries, adnexal regions, and pelvic cul-de-sac.  It was necessary to proceed with endovaginal exam following the transabdominal exam to visualize the CT ABD-PELV W/ CM dated 12/22/2014uterus and adnexa . Color and duplex Doppler ultrasound was utilized to evaluate blood flow to the ovaries.  COMPARISON:  CT ABD-PELV W/ CM dated 12/20/2013  FINDINGS:  Uterus: 4.2 x 7.8 x 5.3 cm. Normal in morphology. Incidental note is made of nabothian cysts.  Endometrium:  Normal, 7 mm.  Right Ovary: 2.8 x 1.6 x 1.8 cm Normal in morphology. Normal color Doppler signal.  Left Ovary: 3.5 x 2.1 x 1.7 cm. Normal in morphology. Normal color Doppler signal.  Other Findings:  No significant free fluid.  Pulsed Doppler evaluation of both ovaries demonstrates normal low-resistance arterial and venous waveforms.  IMPRESSION:  Normal pelvic ultrasound for age with Doppler. No evidence of ovarian/ adnexal torsion and no other explanation for left-sided pain.   Electronically Signed   By: Jeronimo Greaves M.D.   On: 02/05/2014 16:55   US Pelvis Complete  02/05/2014   CLINICAL DATA:  Left-sided pelvic pain for 2 days. Rule out torsion.  EXAM: TRANSABDOMINAL AND TRANSVAGINAL ULTRASOUND OF PELVIS  DOPPLER ULTRASOUND OF OVARIES  TECHNIQUE: Both transabdominal and transvaginal ultrasound examinations of the pelvis were performed. Transabdominal technique was performed for global imaging of the pelvis including uterus, ovaries, adnexal regions, and pelvic cul-de-sac.  It was necessary to proceed with endovaginal exam following the transabdominal exam to visualize the CT ABD-PELV W/ CM dated 12/22/2014uterus and adnexa . Color and duplex Doppler ultrasound was utilized to evaluate blood flow to the ovaries.  COMPARISON:  CT ABD-PELV W/ CM dated 12/20/2013  FINDINGS: Uterus: 4.2 x 7.8 x 5.3 cm. Normal in morphology. Incidental note is made of nabothian cysts.  Endometrium:  Normal, 7 mm.  Right Ovary: 2.8 x 1.6 x 1.8 cm Normal in morphology. Normal color Doppler signal.  Left Ovary: 3.5 x 2.1 x 1.7 cm. Normal in morphology. Normal color Doppler signal.  Other Findings:  No significant free fluid.  Pulsed Doppler evaluation of both ovaries demonstrates normal low-resistance arterial and venous waveforms.  IMPRESSION:  Normal pelvic ultrasound for age with Doppler. No evidence of ovarian/ adnexal  torsion and no other explanation for left-sided pain.   Electronically Signed   By: Jeronimo Greaves M.D.   On: 02/05/2014 16:55   [place] Art/ven Flow Abd Pelv Doppler  02/05/2014   CLINICAL DATA:  Left-sided pelvic pain for 2 days. Rule out torsion.  EXAM: TRANSABDOMINAL AND TRANSVAGINAL ULTRASOUND OF PELVIS  DOPPLER ULTRASOUND OF OVARIES  TECHNIQUE: Both transabdominal and transvaginal ultrasound examinations of the pelvis were performed. Transabdominal technique was performed for global imaging of the pelvis including uterus, ovaries, adnexal regions, and pelvic cul-de-sac.  It was necessary to proceed with endovaginal exam following the transabdominal exam to visualize the CT ABD-PELV W/ CM dated 12/22/2014uterus and adnexa . Color and duplex Doppler ultrasound was utilized to evaluate blood flow to the ovaries.  COMPARISON:  CT ABD-PELV W/ CM dated 12/20/2013  FINDINGS: Uterus: 4.2 x 7.8 x 5.3 cm. Normal in morphology. Incidental note is made of nabothian cysts.  Endometrium:  Normal, 7 mm.  Right Ovary: 2.8 x 1.6 x 1.8 cm Normal in morphology. Normal color Doppler signal.  Left Ovary: 3.5 x 2.1 x 1.7 cm. Normal in morphology. Normal color Doppler signal.  Other Findings:  No significant free fluid.  Pulsed Doppler evaluation of both ovaries demonstrates normal low-resistance arterial and venous waveforms.  IMPRESSION:  Normal pelvic ultrasound for age with Doppler. No evidence of ovarian/ adnexal torsion and no other explanation for left-sided pain.   Electronically Signed   By: Jeronimo Greaves M.D.   On: 02/05/2014 16:55    EKG Interpretation   None       MDM   1. Nausea and vomiting   2. Abdominal pain   3. Pyuria     Filed Vitals:   02/05/14 1742  BP: 114/66  Pulse: 108  Temp: 98 F (36.7 C)  Resp: 18   Afebrile, NAD, non-toxic appearing, AAOx4.   Patient is nontoxic, nonseptic appearing, in no apparent distress.  Patient's pain and other symptoms adequately managed in emergency department.   Fluid bolus given.  Labs, imaging and vitals reviewed.  Patient does not meet the SIRS or Sepsis criteria.  On repeat exam patient does not have a surgical abdomen and there are nor peritoneal signs.  No indication of appendicitis, bowel obstruction, bowel perforation, cholecystitis, diverticulitis, torsions or ovarian cyst, PID or ectopic pregnancy. UA with pyuria and bacteria will treat for UTI. Patient does endorse marked improvement in symptoms after IVF, pain and nausea medications. Patient discharged home with symptomatic treatment and given strict instructions for follow-up with their primary care physician.  I have also discussed reasons to return immediately to the ER.  Patient expresses understanding and agrees with plan.        Jeannetta Ellis, PA-C 02/05/14 2026

## 2014-02-05 NOTE — ED Provider Notes (Signed)
Medical screening examination/treatment/procedure(s) were performed by non-physician practitioner and as supervising physician I was immediately available for consultation/collaboration.  EKG Interpretation   None         Darlys Galesavid Masneri, MD 02/05/14 2340

## 2014-02-05 NOTE — ED Notes (Signed)
C/o n/v x 2 days, states now her stomach is starting to hurt. She denies any bowel/bladder changes. States she can not tolerate any oral intake.

## 2014-02-05 NOTE — Discharge Instructions (Signed)
Please follow up with your primary care physician in 1-2 days. If you do not have one please call the [facility] number listed above. Please take pain medication as prescribed and as needed for pain. Please do not drive on narcotic pain medication. Please take your antibiotic until completion. Please read all discharge instructions and return precautions.    Abdominal Pain, Women Abdominal (stomach, pelvic, or belly) pain can be caused by many things. It is important to tell your doctor:  The location of the pain.  Does it come and go or is it present all the time?  Are there things that start the pain (eating certain foods, exercise)?  Are there other symptoms associated with the pain (fever, nausea, vomiting, diarrhea)? All of this is helpful to know when trying to find the cause of the pain. CAUSES   Stomach: virus or bacteria infection, or ulcer.  Intestine: appendicitis (inflamed appendix), regional ileitis (Crohn's disease), ulcerative colitis (inflamed colon), irritable bowel syndrome, diverticulitis (inflamed diverticulum of the colon), or cancer of the stomach or intestine.  Gallbladder disease or stones in the gallbladder.  Kidney disease, kidney stones, or infection.  Pancreas infection or cancer.  Fibromyalgia (pain disorder).  Diseases of the female organs:  Uterus: fibroid (non-cancerous) tumors or infection.  Fallopian tubes: infection or tubal pregnancy.  Ovary: cysts or tumors.  Pelvic adhesions (scar tissue).  Endometriosis (uterus lining tissue growing in the pelvis and on the pelvic organs).  Pelvic congestion syndrome (female organs filling up with blood just before the menstrual period).  Pain with the menstrual period.  Pain with ovulation (producing an egg).  Pain with an IUD (intrauterine device, birth control) in the uterus.  Cancer of the female organs.  Functional pain (pain not caused by a disease, may improve without  treatment).  Psychological pain.  Depression. DIAGNOSIS  Your doctor will decide the seriousness of your pain by doing an examination.  Blood tests.  X-rays.  Ultrasound.  CT scan (computed tomography, special type of X-ray).  MRI (magnetic resonance imaging).  Cultures, for infection.  Barium enema (dye inserted in the large intestine, to better view it with X-rays).  Colonoscopy (looking in intestine with a lighted tube).  Laparoscopy (minor surgery, looking in abdomen with a lighted tube).  Major abdominal exploratory surgery (looking in abdomen with a large incision). TREATMENT  The treatment will depend on the cause of the pain.   Many cases can be observed and treated at home.  Over-the-counter medicines recommended by your caregiver.  Prescription medicine.  Antibiotics, for infection.  Birth control pills, for painful periods or for ovulation pain.  Hormone treatment, for endometriosis.  Nerve blocking injections.  Physical therapy.  Antidepressants.  Counseling with a psychologist or psychiatrist.  Minor or major surgery. HOME CARE INSTRUCTIONS   Do not take laxatives, unless directed by your caregiver.  Take over-the-counter pain medicine only if ordered by your caregiver. Do not take aspirin because it can cause an upset stomach or bleeding.  Try a clear liquid diet (broth or water) as ordered by your caregiver. Slowly move to a bland diet, as tolerated, if the pain is related to the stomach or intestine.  Have a thermometer and take your temperature several times a day, and record it.  Bed rest and sleep, if it helps the pain.  Avoid sexual intercourse, if it causes pain.  Avoid stressful situations.  Keep your follow-up appointments and tests, as your caregiver orders.  If the  pain does not go away with medicine or surgery, you may try:  Acupuncture.  Relaxation exercises (yoga, meditation).  Group therapy.  Counseling. SEEK  MEDICAL CARE IF:   You notice certain foods cause stomach pain.  Your home care treatment is not helping your pain.  You need stronger pain medicine.  You want your IUD removed.  You feel faint or lightheaded.  You develop nausea and vomiting.  You develop a rash.  You are having side effects or an allergy to your medicine. SEEK IMMEDIATE MEDICAL CARE IF:   Your pain does not go away or gets worse.  You have a fever.  Your pain is felt only in portions of the abdomen. The right side could possibly be appendicitis. The left lower portion of the abdomen could be colitis or diverticulitis.  You are passing blood in your stools (bright red or black tarry stools, with or without vomiting).  You have blood in your urine.  You develop chills, with or without a fever.  You pass out. MAKE SURE YOU:   Understand these instructions.  Will watch your condition.  Will get help right away if you are not doing well or get worse. Document Released: 10/13/2007 Document Revised: 03/09/2012 Document Reviewed: 11/02/2009 [facility] Patient Information 2014 [place], [place]. Urinary Tract Infection Urinary tract infections (UTIs) can develop anywhere along your urinary tract. Your urinary tract is your body's drainage system for removing wastes and extra water. Your urinary tract includes two kidneys, two ureters, a bladder, and a urethra. Your kidneys are a pair of bean-shaped organs. Each kidney is about the size of your fist. They are located below your ribs, one on each side of your spine. CAUSES Infections are caused by microbes, which are microscopic organisms, including fungi, viruses, and bacteria. These organisms are so small that they can only be seen through a microscope. Bacteria are the microbes that most commonly cause UTIs. SYMPTOMS  Symptoms of UTIs may vary by age and gender of the patient and by the location of the infection. Symptoms in young women typically include a  frequent and intense urge to urinate and a painful, burning feeling in the bladder or urethra during urination. Older women and men are more likely to be tired, shaky, and weak and have muscle aches and abdominal pain. A fever may mean the infection is in your kidneys. Other symptoms of a kidney infection include pain in your back or sides below the ribs, nausea, and vomiting. DIAGNOSIS To diagnose a UTI, your caregiver will ask you about your symptoms. Your caregiver also will ask to provide a urine sample. The urine sample will be tested for bacteria and white blood cells. White blood cells are made by your body to help fight infection. TREATMENT  Typically, UTIs can be treated with medication. Because most UTIs are caused by a bacterial infection, they usually can be treated with the use of antibiotics. The choice of antibiotic and length of treatment depend on your symptoms and the type of bacteria causing your infection. HOME CARE INSTRUCTIONS  If you were prescribed antibiotics, take them exactly as your caregiver instructs you. Finish the medication even if you feel better after you have only taken some of the medication.  Drink enough water and fluids to keep your urine clear or pale yellow.  Avoid caffeine, tea, and carbonated beverages. They tend to irritate your bladder.  Empty your bladder often. Avoid holding urine for long periods of time.  Empty your bladder before  and after sexual intercourse.  After a bowel movement, women should cleanse from front to back. Use each tissue only once. SEEK MEDICAL CARE IF:   You have back pain.  You develop a fever.  Your symptoms do not begin to resolve within 3 days. SEEK IMMEDIATE MEDICAL CARE IF:   You have severe back pain or lower abdominal pain.  You develop chills.  You have nausea or vomiting.  You have continued burning or discomfort with urination. MAKE SURE YOU:   Understand these instructions.  Will watch your  condition.  Will get help right away if you are not doing well or get worse. Document Released: 09/25/2005 Document Revised: 06/16/2012 Document Reviewed: 01/24/2012 [facility] Patient Information 2014 [place].

## 2014-02-07 LAB — URINE CULTURE: Colony Count: 80000

## 2014-02-07 LAB — GC/CHLAMYDIA PROBE AMP
CT PROBE, AMP APTIMA: NEGATIVE
GC Probe RNA: NEGATIVE

## 2014-05-20 ENCOUNTER — Ambulatory Visit: Payer: BC Managed Care – PPO | Admitting: Nurse Practitioner

## 2014-08-08 ENCOUNTER — Encounter: Payer: Self-pay | Admitting: Nurse Practitioner

## 2014-08-22 ENCOUNTER — Ambulatory Visit: Payer: BC Managed Care – PPO | Admitting: Nurse Practitioner

## 2014-08-24 ENCOUNTER — Ambulatory Visit: Payer: BC Managed Care – PPO | Admitting: Nurse Practitioner

## 2014-08-24 ENCOUNTER — Telehealth: Payer: Self-pay | Admitting: Nurse Practitioner

## 2014-08-24 NOTE — Telephone Encounter (Signed)
Patient was no show for today's office appointment.  

## 2014-10-31 ENCOUNTER — Encounter (HOSPITAL_COMMUNITY): Payer: Self-pay | Admitting: Emergency Medicine

## 2015-01-16 ENCOUNTER — Encounter: Payer: Self-pay | Admitting: Adult Health

## 2015-01-16 ENCOUNTER — Ambulatory Visit (INDEPENDENT_AMBULATORY_CARE_PROVIDER_SITE_OTHER): Payer: BC Managed Care – PPO | Admitting: Adult Health

## 2015-01-16 VITALS — BP 117/83 | HR 83 | Ht 64.0 in | Wt 178.0 lb

## 2015-01-16 DIAGNOSIS — G4700 Insomnia, unspecified: Secondary | ICD-10-CM

## 2015-01-16 DIAGNOSIS — G43009 Migraine without aura, not intractable, without status migrainosus: Secondary | ICD-10-CM

## 2015-01-16 MED ORDER — ZOLPIDEM TARTRATE 10 MG PO TABS: 10.0000 mg | ORAL_TABLET | Freq: Every evening | ORAL | Status: AC | PRN

## 2015-01-16 MED ORDER — TOPIRAMATE 100 MG PO TABS: 200.0000 mg | ORAL_TABLET | Freq: Every day | ORAL | Status: AC

## 2015-01-16 MED ORDER — PROMETHAZINE HCL 50 MG/ML IJ SOLN: 50.0000 mg | Freq: Four times a day (QID) | INTRAMUSCULAR | Status: DC | PRN

## 2015-01-16 NOTE — Addendum Note (Signed)
Addended by: Enedina FinnerMILLIKAN, [place] Levee P on: 01/16/2015 05:37 PM   Modules accepted: Level of Service

## 2015-01-16 NOTE — Patient Instructions (Signed)
I will refill Topamax 100 mg 2 tablets daily. I will take over prescribing Ambien 10 mg at bedtime I will refill phenergan today.  Topamax, Ambien and phenergan can cause sedation.  If you need phenergan for migraine I would NOT take Ambien as well.

## 2015-01-16 NOTE — Progress Notes (Signed)
PATIENT: [name] DOB: 07-09-1986  REASON FOR VISIT: follow up- migraine headaches HISTORY FROM: patient  HISTORY OF PRESENT ILLNESS: [name] is a 29 year old female with a history of migraine headaches. She returns today for follow-up. She states a few months ago she had stopped all of her medication. Within the last month her headaches have gotten worse. She went to her primary care provider who restarted her on Topamax. She is currently taking Topamax 200 mg daily. She also states that she went and had a "ear piercing" that was supposed to help with migraines. She states that it has helped. She has not had a headache since having the ear piercing. She is also taking Ambien 10 mg at bedtime for insomnia. She states that this is working well. Patient states that her sleep hygiene is not good. She does not have a regular bedtime. She does watch TV or work on her computer. When the patient gets an acute headache she will use Phenergan injections and toradol. However she states that phenergan has not been available by her pharmacy. The patient states that her primary care provider would like [place] to take over the Topamax. The patient would like [place] to start prescribing the Ambien so she gets her medications from one place.    HISTORY 02/04/14: 19 year Caucasian lady with migraine headaches since 6 th grade in school off and on which have increased in last 5 months to 1-2 per week.These are severe throbbing periorbital and occipital, present upon awakening and last 1-2 days.there is nausea, vomitting and worsening with activity and relief with sleep. 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 She is currently on Topamax 400 mg without clear efficacy.She has not tried elavil or zanaflex for prophylaxis.she was seen recently in [facility] ER with several days of headache, dehydration.She has had brain imaging studies in past but my review of [facility] imaging records and our office notes do not mention any reference to these.She takes toradol 60 mg and phenergan 25 mg im self injections for symptomatic relief 1-2 times per month   UPDATE 09/21/13 (LL): Patient comes in for acute visit, having severe Migraine for 3 days. Last ER visit was 09/16/13 in which she received headache Toradol, Zofran, Morphine, Decadron, and 1,000 mL NS bolus. She states she cannot even keep water down due to nausea and vomiting. She reports blurred vision and black spots in right eye and tingling on right side of face, arm and leg up to knee. She has not had MRI/MRA done yet.   UPDATE 10/25/13 (PS): She is seen urgently today as she called complaining of persistent severe headaches. She has gone to the emergency room several times and states is having 2-3 bad headaches a week now. She does not find relief with the current medications and has gone to the ER and received diluted injections. She did not take some fracture on a daily basis as I had instructed but instead took it on an as needed basis couple of doses and  did not have it effective and stopped. She continues to take amitriptyline 50 mg at night. She underwent MRI scan of the brain and MRA of the brain on 10/06/13 at [facility] which were both normal. 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 The numbness lasts a couple of days where as the vision improves. She finds relief only by going to sleep.   UPDATE 02/04/14 (LL): [name] returns for follow up. Her Migraines have become much less frequent over the last 3 months. She has weaned herself off many medications that were prescribed by a psychiatrist she was seeing. She has had about a 50 lb. Weight gain in the past 6 months, she attributes to Mirtazapine. She has had one ER visit for intractable migraine on 2/3, the headache had lasted 3 days with nausea and vomiting. She states that if she can catch Migraine in early stage and take 3 benadryl tablets, the headache will stop. She has been having fever, chills, nausea and vomiting without headache or diarrhea since yesterday, but did not want to miss today's appointment.  REVIEW OF SYSTEMS: Out of a complete 14 system review of symptoms, the patient complains only of the following symptoms, and all other reviewed systems are negative.  See HPI    ALLERGIES: Allergies  Allergen Reactions  . Latex Rash    HOME MEDICATIONS: Outpatient Prescriptions Prior to Visit  Medication Sig Dispense Refill  . amitriptyline (ELAVIL) 25 MG tablet Take 2 tablets (50 mg total) by mouth at bedtime. 60 tablet 5  . cephALEXin (KEFLEX) 500 MG capsule Take 1 capsule (500 mg total) by mouth 4 (four) times daily. 40 capsule 0  . diphenhydrAMINE (BENADRYL) 25 MG tablet Take 25 mg by mouth every 6 (six) hours as needed (migraine).     Marland Kitchen HYDROcodone-acetaminophen (NORCO/VICODIN) 5-325 MG per tablet Take 1-2 tablets by mouth every 6 (six) hours as needed for severe pain. 10 tablet 0  . ketorolac (TORADOL) 30 MG/ML injection Inject 30 mg into the vein once as needed (as needed for migraines).    . ondansetron (ZOFRAN ODT) 4 MG disintegrating tablet Take 1 tablet (4 mg total) by mouth every 8 (eight) hours as needed for nausea or vomiting. 10 tablet 0  . promethazine (PHENERGAN) 50 MG/ML injection Inject 1 mL (50 mg total) into the muscle every 6 (six) hours as needed for nausea or vomiting. 20 mL 5  . tiZANidine (ZANAFLEX) 2 MG tablet  Take 4 mg by mouth at bedtime as needed for muscle spasms.    . traZODone (DESYREL) 50 MG tablet Take 1 tablet (50 mg total) by mouth at bedtime. 30 tablet 5   Facility-Administered Medications Prior to Visit  Medication Dose Route Frequency Provider Last Rate Last Dose  . prochlorperazine (COMPAZINE) injection 10 mg  10 mg Intravenous Once Ronal Fear, NP      . valproate (DEPACON) 1,000 mg in sodium chloride 0.9 % 100 mL IVPB  1,000 mg Intravenous Continuous Ronal Fear, NP 110 mL/hr at 09/21/13 1750 1,000 mg at 09/21/13 1750    PAST MEDICAL HISTORY: Past Medical History  Diagnosis Date  . Migraine     hemiplegic  . Migraine     PAST SURGICAL HISTORY: Past Surgical History  Procedure Laterality Date  . Cholecystectomy  2013  . Dental surgery      FAMILY HISTORY: Family History  Problem Relation Age of Onset  . Cancer -  Other Mother 57  . Hypertension Mother   . Hypertension Father   . Breast cancer Maternal Grandmother     SOCIAL HISTORY: History   Social History  . Marital Status: Married    Spouse Name: dan    Number of Children: 3  . Years of Education: college   Occupational History  . N/A    Social History Main Topics  . Smoking status: Former Smoker -- 1.00 packs/day for 5 years  . Smokeless tobacco: Never Used  . Alcohol Use: Yes     Comment: 4/week  . Drug Use: No  . Sexual Activity: No   Other Topics Concern  . Not on file   Social History Narrative      PHYSICAL EXAM  Filed Vitals:   01/16/15 1424  BP: 117/83  Pulse: 83  Height:  (1.626 m)  Weight: 178 lb (80.74 kg)   Body mass index is 30.54 kg/(m^2).  Generalized: Well developed, in no acute distress   Neurological examination  Mentation: Alert oriented to time, place, history taking. Follows all commands speech and language fluent Cranial nerve II-XII: Pupils were equal round reactive to light. Extraocular movements were full, visual field were full on confrontational test.  Facial sensation and strength were normal. Uvula tongue midline. Head turning and shoulder shrug  were normal and symmetric. Motor: The motor testing reveals 5 over 5 strength of all 4 extremities. Good symmetric motor tone is noted throughout.  Sensory: Sensory testing is intact to soft touch on all 4 extremities. No evidence of extinction is noted.  Coordination: Cerebellar testing reveals good finger-nose-finger and heel-to-shin bilaterally.  Gait and station: Gait is normal. Tandem gait is normal. Romberg is negative. No drift is seen.  Reflexes: Deep tendon reflexes are symmetric and normal bilaterally.  Marland Kitchen   DIAGNOSTIC DATA (LABS, IMAGING, TESTING) - I reviewed patient records, labs, notes, testing and imaging myself where available.  Lab Results  Component Value Date   WBC 11.8* 02/05/2014   HGB 13.3 02/05/2014   HCT 39.0 02/05/2014   MCV 83.7 02/05/2014   PLT 351 02/05/2014      Component Value Date/Time   NA 139 02/05/2014 1444   K 4.1 02/05/2014 1444   CL 101 02/05/2014 1444   CO2 21 02/05/2014 1444   GLUCOSE 119* 02/05/2014 1444   BUN 9 02/05/2014 1444   CREATININE 0.72 02/05/2014 1444   CALCIUM 9.1 02/05/2014 1444   PROT 7.5 02/05/2014 1444   ALBUMIN 4.2 02/05/2014 1444   AST 17 02/05/2014 1444   ALT 17 02/05/2014 1444   ALKPHOS 61 02/05/2014 1444   BILITOT 0.3 02/05/2014 1444   GFRNONAA >90 02/05/2014 1444   GFRAA >90 02/05/2014 1444      ASSESSMENT AND PLAN 29 y.o. year old female  has a past medical history of Migraine and Migraine. here with:  1. Migraine headaches 2. Insomnia  Overall the patient is doing well. She states that she has not had a migraine since having her "ear pierced." I will continue the patient on Topamax 200 mg at bedtime. I will take over prescribing her Ambien 10 mg as needed for sleep. I have instructed the patient on good sleep hygiene as well. I have encouraged her to adapt to a consistent sleep routine and to avoid the TV and  computer one hour before bedtime.. I will refill her phenergan today as well. I advised patient that if she needs to use her Phenergan for acute headache, I would  not take the Ambien that day. As these medications can cause sedation. Patient verbalized understanding. If the patient's symptoms worsen or she develops new symptoms she should let us know. Otherwise she will follow up in 4 months or sooner if needed.  Butch PennyMegan Waunita Sandstrom, MSN, NP-C 01/16/2015, 2:38 PM [facility] [address], [address] [place], [place] [address] [phone]  Note: This document was prepared with digital dictation and possible smart phrase technology. Any transcriptional errors that result from this process are unintentional.

## 2015-01-17 NOTE — Progress Notes (Signed)
I agree with the above plan 

## 2015-04-18 NOTE — H&P (Signed)
Subjective/Chief Complaint ruq pain    History of Present Illness 2 weeks pain, n/v, mult daily emesis, phenrgan inj not helping diarrhea  one week no f/c    Past History migraines PSH dental   Past Med/Surgical Hx:  Migraines:   dental implant:   ALLERGIES:  Latex: Rash  Family and Social History:   Family History mother with GB surgery    Social History negative tobacco, negative ETOH, at home with 3 kids    Place of Living Home   Review of Systems:   Fever/Chills No    Cough No    Abdominal Pain Yes    Diarrhea Yes    Constipation No    Nausea/Vomiting Yes    SOB/DOE No    Chest Pain No    Dysuria No    Tolerating Diet No  Nauseated  Vomiting   Physical Exam:   GEN uncomfortable    HEENT pink conjunctivae    NECK supple    RESP normal resp effort  clear BS    CARD regular rate    ABD positive tenderness  soft  Murphy's sign pos    LYMPH negative neck    EXTR negative edema    SKIN normal to palpation    PSYCH alert, A+O to time, place, person, good insight   Lab Results: Hepatic:  16-Sep-13 16:02    Bilirubin, Total 0.2   Alkaline Phosphatase 71   SGPT (ALT) 18   SGOT (AST)  11   Total Protein, Serum 7.5   Albumin, Serum 3.9  Routine Chem:  16-Sep-13 16:02    Glucose, Serum 99   BUN 9   Creatinine (comp) 0.71   Sodium, Serum 141   Potassium, Serum  3.1   Chloride, Serum  109   CO2, Serum 22   Calcium (Total), Serum 8.5   Osmolality (calc) 280   eGFR (African American) >60   eGFR (Non-African American) >60 (eGFR values <8m/min/1.73 m2 may be an indication of chronic kidney disease (CKD). Calculated eGFR is useful in patients with stable renal function. The eGFR calculation will not be reliable in acutely ill patients when serum creatinine is changing rapidly. It is not useful in  patients on dialysis. The eGFR calculation may not be applicable to patients at the low and high extremes of body sizes, pregnant women,  and vegetarians.)   Anion Gap 10   Lipase 186 (Result(s) reported on 14 Sep 2012 at 04:46PM.)  Routine UA:  16-Sep-13 16:02    Color (UA) Yellow   Clarity (UA) Cloudy   Glucose (UA) Negative   Bilirubin (UA) Negative   Ketones (UA) Negative   Specific Gravity (UA) 1.024   Blood (UA) Negative   pH (UA) 6.0   Protein (UA) 30 mg/dL   Nitrite (UA) Negative   Leukocyte Esterase (UA) 2+ (Result(s) reported on 14 Sep 2012 at 05:20PM.)   RBC (UA) 3 /HPF   WBC (UA) NONE SEEN   Bacteria (UA) 3+   Epithelial Cells (UA) 23 /HPF   Mucous (UA) PRESENT (Result(s) reported on 14 Sep 2012 at 05:20PM.)  Routine Hem:  16-Sep-13 16:02    WBC (CBC) 10.3   RBC (CBC) 4.04   Hemoglobin (CBC) 12.2   Hematocrit (CBC) 36.1   Platelet Count (CBC) 416 (Result(s) reported on 14 Sep 2012 at 04:26PM.)   MCV 89   MCH 30.2   MCHC 33.8   RDW 13.5   Radiology Results: [place]    12-Sep-13  09:14, US Abdomen General Survey   US Abdomen General Survey   REASON FOR EXAM:    abd pain    vomiting  COMMENTS:       PROCEDURE: KUS - KUS ABDOMEN UPPER GENERAL  - Sep 10 2012  9:14AM     RESULT: History: Pain.    Comparison Study: Ultrasound abdomen of 05/08/2012.     Findings Borderline hepatomegaly. Pancreas normal. Spleen normal.   Gallbladder sludge. Negative Murphy's sign. Gallbladder wall thickness at    2.0 mm, the common bile duct diameter normal at 2.8 mm. No   hydronephrosis.    IMPRESSION:  Gallbladder sludge, borderline hepatomegaly.    Verified By: Osa Craver, M.D., MD  Nuclear Med:    16-Sep-13 11:10, Hepatobiliary Image - Nuc Med   Hepatobiliary Image - Nuc Med   REASON FOR EXAM:    gallbladder sludge on [place]    abd pain  COMMENTS:       PROCEDURE: NM  - NM HEPATOBILIARY IMAGE  - Sep 14 2012 11:10AM     RESULT: The patient received a dose of 8.0 mCi of technetium 64m  Choletec. There is prompt extraction of tracer from the blood pool by the   liver. Gallbladder activity is present by  15 minutes. Common bile duct   activity is seen at 30 minutes with small bowel localization at 30 to 40   minutes. There is progressive clearing of activity from the liver with a   small amount of activity remaining in the hepatic parenchyma at 60   minutes.    IMPRESSION:   1. Normal filling of the gallbladder.   2. Normal patency of the common bile duct.   3. Unremarkable study.    Thank you for the opportunity to contribute to the care of your patient.     Dictation Site: 2          Verified By: GSundra Aland M.D., MD     Assessment/Admission Diagnosis biliary colic with dehydration due to N/V and diarrhea admit hydrate correct electrolytes lap chole once able risks rev'd   Electronic Signatures: CFlorene Glen(MD)  (Signed 16-Sep-13 19:48)  Authored: CHIEF COMPLAINT and HISTORY, PAST MEDICAL/SURGIAL HISTORY, ALLERGIES, FAMILY AND SOCIAL HISTORY, REVIEW OF SYSTEMS, PHYSICAL EXAM, LABS, Radiology, ASSESSMENT AND PLAN   Last Updated: 16-Sep-13 19:48 by CFlorene Glen(MD)

## 2015-04-18 NOTE — Discharge Summary (Signed)
PATIENT NAMLeane [name]:  Aumiller, Katonya MR#:  [id number] DATE OF BIRTH:  1986-12-28  DATE OF ADMISSION:  09/14/2012 DATE OF DISCHARGE:  09/17/2012  FINAL DIAGNOSIS:  Symptomatic cholelithiasis   PRINCIPAL PROCEDURES:  Laparoscopic cholecystectomy.   HOSPITAL COURSE: The patient was admitted with prolonged history of nausea, vomiting, and abdominal pain. Ultrasonography demonstrated stones and sludge. HIDA scan was performed which demonstrated no evidence of filling defects seen within the bile duct. No cystic duct obstruction. On 09/16/2012 the patient was taken to the Operating Room for cholecystectomy. She tolerated this well. Postoperatively, she looked much more comfortable. Her abdomen was soft and nontender and she was discharged home in stable condition on 09/17/2012 postoperative day number 2.   DISCHARGE MEDICATIONS:  1. Cymbalta 60 mg delayed release one cap by mouth daily 2. Topamax 200 mg by mouth daily 3. Percocet 5/325, 1 to 2 tabs p.o. every 4 to 6 hours p.r.n. pain  4. Phenergan 25 mg by mouth every 4 to 6 hours as needed, nausea and vomiting  DISCHARGE INSTRUCTIONS: She will follow up with me in our office in 7 to 10 days.    ____________________________ Redge GainerMark A. Egbert GaribaldiBird, MD mab:tbf D: 09/20/2012 16:54:23 ET T: 09/21/2012 15:18:18 ET JOB#: [id number]  cc: Loraine LericheMark A. Egbert GaribaldiBird, MD, <Dictator> Raynald KempMARK A Antoniette Peake MD ELECTRONICALLY SIGNED 09/23/2012 9:26

## 2015-04-18 NOTE — H&P (Signed)
PATIENT NAMLeane [name]:  Godman, Priyanka MR#:  [id number] DATE OF BIRTH:  [birth year]  DATE OF ADMISSION:  09/14/2012  CHIEF COMPLAINT: Right upper quadrant pain.   HISTORY OF PRESENT ILLNESS: This is a patient with two weeks of right upper quadrant pain. She has had extensive nausea and vomiting, vomiting so many times a day that she cannot even take her medicines at this point. She has migraines and has been given a prescription for Phenergan injectable which she has tried at home and it has not made a difference in her vomiting and has been unable to control her nausea. She denies fevers or chills. Has had diarrhea for the last one week. Has had some back pain associated with this as well.   Of note, her mother had similar symptoms resulting in cholecystectomy and is present during this interview.   PAST MEDICAL HISTORY: Migraines.   PAST SURGICAL HISTORY: Dental surgery implant.   FAMILY HISTORY: Mother had gallbladder disease.   SOCIAL HISTORY: The patient does not smoke or drink. She lives at home with her three children.   ALLERGIES: Latex.   MEDICATIONS: Topamax.   REVIEW OF SYSTEMS: A 10 system review was performed and negative with the exception of that mentioned in the history of present illness.   PHYSICAL EXAMINATION:   GENERAL: Uncomfortable-appearing Caucasian female patient.   VITAL SIGNS: Temperature 99.1, pulse 99, respirations 18, blood pressure 133/85. Pain scale of 10.   HEENT: No scleral icterus.   NECK: No palpable neck nodes.   CHEST: Clear to auscultation.   CARDIAC: Regular rate and rhythm.   ABDOMEN: Abdomen is soft but there is tenderness in the right upper quadrant with a positive Murphy's sign. There is a supraumbilical piercing. No guarding or rebound tenderness.   EXTREMITIES: Without edema. Calves are nontender.   NEUROLOGIC: Grossly intact.   INTEGUMENTARY: No jaundice.   LABORATORY, DIAGNOSTIC, AND RADIOLOGICAL DATA: An ultrasound shows sludge and a  HIDA scan is normal. No CCK was utilized.   Urinalysis shows 2+ leukocyte esterase positivity, specific gravity of 1.024. Lipase 186. Liver function tests are within normal limits. Electrolytes show a potassium of 3.1, chloride of 109, sodium 141. Hemogram shows a white blood cell count of 10.3, hemoglobin and hematocrit 12.2 and 36.1, and a platelet count of 416.   ASSESSMENT AND PLAN: This is a patient with biliary colic with extensive nausea and vomiting. This has been going on for over two weeks now. She is dehydrated and cannot even keep her medications down for migraines. I have asked that she be admitted to the hospital for control of her nausea and vomiting and pain and then perform laparoscopic cholecystectomy when feasible. The rationale for this approach has been discussed with she and her mother, the options of observation and home management has been discussed, the fact that she has failed home management even with injectable Phenergan has been discussed.     The planned laparoscopic cholecystectomy was reviewed and the risks of bleeding, infection, conversion to an open procedure, bile duct damage, bile duct leak, retained common bile duct stone, any of which could require further surgery and/or ERCP, stent, and papillotomy were all discussed with she and her mother. They understood and agreed to proceed.    ____________________________ Adah Salvageichard E. Excell Seltzerooper, MD rec:drc D: 09/14/2012 19:52:35 ET T: 09/15/2012 06:26:21 ET JOB#: [id number]  cc: Adah Salvageichard E. Excell Seltzerooper, MD, <Dictator> Lattie HawICHARD E Isreal Moline MD ELECTRONICALLY SIGNED 09/15/2012 21:01

## 2015-04-18 NOTE — Op Note (Signed)
PATIENT NAMLeane Para:  [name], [name] MR#:  [id number] DATE OF BIRTH:  Dec 13, 1986  DATE OF PROCEDURE:  09/16/2012  PREOPERATIVE DIAGNOSIS: Symptomatic cholelithiasis.   POSTOPERATIVE DIAGNOSIS: Symptomatic cholelithiasis.   PROCEDURE PERFORMED: Laparoscopic cholecystectomy.   ATTENDING SURGEON: Miranda Frese A. Egbert GaribaldiBird, MD FACS ASSISTANT SURGEON: None.   TYPE OF ANESTHESIA: General oral endotracheal.   FINDINGS: Stones and sludge.   SPECIMENS: Gallbladder and contents.   ESTIMATED BLOOD LOSS: Minimal.   DESCRIPTION OF PROCEDURE: With the patient in the supine position, general endotracheal anesthesia was induced. Her abdomen was widely prepped and draped with ChloraPrep solution. The left arm was padded and tucked at her side. Time out was observed.   A 12 mm blunt Hassan trocar was placed through an open technique with stay sutures being passed through the fascia, in a vertical orientation. Pneumoperitoneum was established. Limited laparoscopic evaluation of the abdomen demonstrated no acute findings. The patient was positioned in reverse Trendelenburg and airplane right side up. A 5 mm Bladeless trocar was placed in the epigastric region. Two 5 mm ports were then placed in the right subcostal margin. The gallbladder was grasped along its fundus and elevated towards the right shoulder. Adhesions of the omentum were taken down with sharp technique and point cautery. Lateral traction was placed on Hartmann's pouch. The hepatoduodenal ligament was then incised with blunt technique and the cystic duct and cystic artery were identified with a critical view of safety cholecystectomy being performed. The gallbladder cystic duct junction was identified. The cystic duct was triply clipped on the portal side, singly clipped on the gallbladder side and divided. The cystic artery was likewise divided between two hemoclips on the portal side and one on the gallbladder side. The gallbladder was then retrieved off the gallbladder  fossa and placed into an Endo Catch device and retrieved.   This was done using the 5 mm scope and camera through the epigastric point.  Inspection of the umibilcal port showed no evidence of bowel injury with hasson cannula.  Pneumoperitoneum was reestablished. With hemostasis being ensured on the operative field, ports were then removed under direct visualization and hemostasis was ensured on the operative field.   The infraumbilical fascial defect was closed with a figure-of-eight #0 Vicryl suture in vertical orientation and the existing stay sutures being tied to each other. 4-0 Vicryl subcuticular was applied to all skin edges followed by application of benzoin, Steri-Strips, Telfa, and Tegaderm. The patient was then subsequently extubated and taken to the recovery room in stable and satisfactory condition by anesthesia services.  ____________________________  Redge GainerMark A. Egbert GaribaldiBird, MD FACS mab:slb D: 09/17/2012 14:03:00 ET T: 09/17/2012 16:23:34 ET JOB#: [id number]  cc: Loraine LericheMark A. Egbert GaribaldiBird, MD, <Dictator> Raynald KempMARK A Hieu Herms MD ELECTRONICALLY SIGNED 09/17/2012 18:25

## 2015-04-21 NOTE — Consult Note (Signed)
Brief Consult Note: Diagnosis: Elevated LFTs.   Patient was seen by consultant.   Consult note dictated.   Comments: 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  Denies any abdominal pain.  ALP elevated 333 yest, 279 today, AST 1354 yest, 407 today, & ALT 1266 today, 779 yesterday.  RUQ ultrasound shows CBD 7.697mm s/p cholecystectomy which is physiologic & a normal liver.  Acetaminophen level normal.  Pt denies any illicit drug use or OTC herbal medications.  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  This was associated with headaches as well.  She will need complete serologic hepatic work-up to r/o autoimmune liver disease, Wilson's disease, alpha-1 antitrypsin deficiency, less likely acute viral hepatitis or other liver diseases.    She also reports diarrhea & we are awaiting full set of stool studies too.  Plan: 1) FU ANA, ceruloplasmin, iron 2) AMA, immunoglobulins, A-1 antitrypsin, anti-SMA 3) FU stool studies 4) Supportive measures 5) UDS  #[id number].  Please see full dictated note.  Thanks for consult..  Electronic Signatures: Joselyn ArrowJones, Leen Tworek L (NP)  (Signed 21-Aug-14 13:58)  Authored: Brief Consult Note   Last Updated: 21-Aug-14 13:58 by Joselyn ArrowJones, Ajee Heasley L (NP)

## 2015-04-21 NOTE — Op Note (Signed)
PATIENT NAMLeane [name]:  [name] MR#:  [id number] DATE OF BIRTH:  Feb 07, 1986  DATE OF PROCEDURE:  12/21/2013  PREOPERATIVE  DIAGNOSIS:  Pelvic pain.  Desire for permanent sterility  POSTOPERATIVE DIAGNOSIS:  Pelvic pain.  Desire for permanent sterility  PROCEDURE: Operative laparoscopy, bilateral salpingectomies, biopsy of posterior peritoneum / uterosacral ligament.   SURGEON:  Annamarie MajorPaul Taffie [name], M.D.   ANESTHESIA: General.   ESTIMATED BLOOD LOSS: Minimal.   COMPLICATIONS: None.   FINDINGS: The patient had no signs of significant adhesions, endometriosis, pelvic inflammatory disease, pelvic congestion syndrome, fibroids, ovarian cyst or other abnormalities. Fallopian tubes were normal in appearance upon their removal for contraception purposes. There is a small nodule near the left uterosacral ligament approximately 0.5 cm in size, that was black and blue and this was excised for biopsy purposes. The patient goes to the recovery room in stable condition.   TECHNIQUE: The patient is prepped and draped in the usual sterile fashion after adequate anesthesia is obtained in the dorsal lithotomy position. Bladder is drained with a Foley catheter and is removed prior to the surgery. A Hulka tenaculum is placed on the cervix for manipulation purposes.   Attention is then turned to the abdomen where a Veress needle inserted through a 5 mm infraumbilical incision after Marcaine is used to anesthetize the skin. The Veress needle placement is confirmed using the hanging drop technique and the abdomen is then insufflated with CO2 gas. A 5 mm trocar is then inserted under direct visualization with the laparoscope with no injuries or bleeding noted. The patient is placed in Trendelenburg position and the above-mentioned findings are visualized.   A 5 mm trocar is placed in the right lower quadrant lateral to the inferior epigastric blood vessels and an 11 mm trocar is placed in the suprapubic region for manipulation  and instrumentation purposes. The right fallopian tube is grasped and carefully dissected from the mesosalpinx with preservation of the ovary and blood supply on the right side and is removed. The left fallopian tube was also grasped and dissected free with preservation of the ovary and its main blood supply on the left side and removed and sent to pathology for further review. Excellent hemostasis is noted. Nodule over the left uterosacral ligament is grasped and carefully dissected free without any apparent damage to the underlying structures such as ureter or uterosacral ligament itself.  It is sent to pathology for further review. Excellent hemostasis is noted. The pelvic cavity is irrigated with aspiration of fluid. Gas is expelled. The patient is leveled. Trocars are removed. The skin is closed with Dermabond. Hulka tenaculum is removed from the cervix. Up and inspection. There some bleeding and placed and the cervix to removed from the cervix. Upon inspection, there is some bleeding and a suture is placed in the cervix to improve hemostasis at this site. The patient goes to the recovery room in stable condition. All sponge, instrument, and needle counts are correct.    ____________________________ R. Annamarie MajorPaul Camilah Spillman, MD rph:dp D: 12/21/2013 13:16:57 ET T: 12/21/2013 13:37:38 ET JOB#: [id number]  cc: [name] Searles. [name] [name] Nelson, MD, <Dictator> [name] MustardOBERT P Azaylea Maves MD ELECTRONICALLY SIGNED 12/21/2013 15:24

## 2015-04-21 NOTE — Consult Note (Signed)
PATIENT NAMLeane [name]:  Coletti, Kynlea MR#:  [id number] DATE OF BIRTH:  [birth year]  DATE OF CONSULTATION:  05/24/2013  CONSULTING PHYSICIAN:  Jaala Bohle K. Kesha Hurrell, MD  SUBJECTIVE:  The patient was seen in consultation in room #214.  The patient is not employed and is married and lives with the husband along with their 3 children, who are 457, 215 and 31 years old, and gets overburdened with the same.  The patient was seen with her mother in the room.  According to information obtained from the patient and the mother, she has been stressed out taking care of her 3 children and having to deal with her husband, with whom she had marital conflicts.    PAST PSYCHIATRIC HISTORY: No previous history of inpatient hospitalizations on psychiatry, no history of suicide attempt, not being followed by a psychiatrist.    ALCOHOL AND DRUGS:  Denied.   MENTAL STATUS EXAM: The patient is alert and oriented, calm, pleasant and cooperative.  No agitation.  Affect is flat with mood depressed, became teary-eyed talking about the marital conflicts and being overburdened taking care of her 3 children and getting into anxiety issues which are not being addressed.  No psychosis.  Denies any suicidal or homicidal ideas or plan but does admit feeling hopeless and helpless and overburdened with the current situation.  Insight and judgment are guarded.   IMPRESSION: 1.  Rule out somatization disorder with anxiety. 2.  Marital conflicts with her depression.   RECOMMENDATIONS:  1.  Start the patient on Celexa 20 mg p.o. per day to help her with her depression. Start the patient on Klonopin 0.25 mg p.o. b.i.d.  to help her with her anxiety.  2.  I recommend that the patient should get a counseling on an outpatient basis where she can ventilate her feelings and get help with coping skills.  ____________________________ Jannet MantisSurya K. Guss Bundehalla, MD skc:cb D: 05/24/2013 17:34:51 ET T: 05/24/2013 17:40:57 ET JOB#: [id number]  cc: Monika SalkSurya K. Guss Bundehalla, MD,  <Dictator> Beau FannySURYA K Mayline Dragon MD ELECTRONICALLY SIGNED 05/29/2013 15:31

## 2015-04-21 NOTE — Discharge Summary (Signed)
PATIENT NAMEJAIDAH, [name] MR#:  [id number] DATE OF BIRTH:  [birth year]  DATE OF ADMISSION:  08/18/2013 DATE OF DISCHARGE:  08/20/2013  For a detailed note, please take a look at the history and physical done on admission by Dr. Jacques Navy.   DIAGNOSES AT DISCHARGE: Acute hepatitis and abnormal liver LFTs secondary to polypharmacy, viral pharyngitis, history of migraines, depression/anxiety, history of narcotic-seeking and drug-seeking behavior.   DIET: The patient is being discharged on a regular diet.   ACTIVITY: As tolerated.   FOLLOWUP: With Dr. Midge Minium in the next 1 to 2 weeks. Also follow up with Dr. Fidela Juneau in the next 1 to 2 weeks.  DISCHARGE MEDICATIONS: Promethazine 50 mg q.6 hours as needed, Toradol IM 60 mg as needed, Topamax 200 mg 2 tabs at bedtime, Seroquel 100 mg at bedtime, Valium 10 mg t.i.d. as needed, Klonopin 0.5 mg b.i.d., Seroquel 25 mg t.i.d. as needed, Robitussin 5 to 10 mL q.4 hours as needed, Cymbalta 60 mg daily and Tylenol with hydrocodone 5/325 one tab q.6 hours as needed for pain.   CONSULTANTS DURING THE HOSPITAL COURSE: Dr. Midge Minium from gastroenterology.   PERTINENT STUDIES DONE: A chest x-ray done on admission showing no acute cardiopulmonary disease. An ultrasound of the abdomen showing unremarkable right upper quadrant ultrasound.   BRIEF HOSPITAL COURSE: This is a 30 year old female with medical problems as mentioned above, presented to the hospital on 08/26/2013 secondary to significantly abnormal LFTs.  1.  Acute hepatitis: The patient presented with severely abnormal LFTs, with AST as high as 1300 and ALT of 1200. The exact etiology of the elevated LFTs is probably secondary to polypharmacy. The patient was on Cymbalta for her chronic migraines. Her dose had been recently escalated from 60 to 90 mg. She was also placed on oral Augmentin for a possible strep throat. Therefore, a combination of medications caused her LFTs to go up. She had a benign  abdominal ultrasound. Her Tylenol level was normal. She also underwent a serologic work-up for autoimmune hepatitis, the results partly of which are still pending, but essentially has been benign. Her hepatitis panel was also benign. Her LFTs were followed for the next 2 days and they have much improved and normalized now. The patient was advised to go back to the lower dose of Cymbalta and continue followup with her neurologist, Dr. Redmond Baseman, for further management of her migraines. The patient will also continue followup with GI to have repeat LFTs done and follow up the results of her autoimmune hepatitis within the next few weeks.  2.  Viral  pharyngitis: The patient complained of a sore throat and odynophagia. She did not have any exudates on exam. Her beta-hemolytic strep test was negative. She was on Augmentin prior to coming in, which I discontinued, as that possibly could have contributed to her abnormal LFTs. She was told to continue supportive care with Chloraseptic spray and a humidifier and throat lozenges for her viral pharyngitis. She was advised if her symptoms got worse to follow up with an ENT as an outpatient.  3.  History of migraines: The patient is followed by Dr. Redmond Baseman in [place]. She will continue Topamax, Seroquel and her Cymbalta as stated for the management of her migraines.  4.  Depression/anxiety: The patient is followed by Dr. Maryruth Bun. She will continue followup with her. For now, she will continue her Klonopin and Valium as stated.   CODE STATUS: The patient is a full code.   TIME SPENT WITH  DISCHARGE: 40 minutes.   ____________________________ [name] J. Cherlynn KaiserSainani, MD vjs:jm D: 08/20/2013 14:37:34 ET T: 08/20/2013 15:11:11 ET Rolly PancakeJOB#: [id number]  cc: Rolly PancakeVivek J. Cherlynn KaiserSainani, MD, <Dictator> Midge Miniumarren Wohl, MD Reola MosherAndrew S. Randa LynnLamb, MD Houston SirenVIVEK J SAINANI MD ELECTRONICALLY SIGNED 08/23/2013 13:41

## 2015-04-21 NOTE — H&P (Signed)
PATIENT NAMETERESEA, [name] MR#:  [id number] DATE OF BIRTH:  [birth year]  DATE OF ADMISSION:  05/20/2013  PRIMARY CARE PHYSICIAN:  Dr. Arline Asp  CHIEF COMPLAINT: Nausea, vomiting, diarrhea.   HISTORY OF PRESENT ILLNESS:  A 29 year old female with a history of migraines, who presents with the above complaint. Since yesterday, about 2:30 a.m., the patient has had nausea, vomiting and diarrhea. She has had several loose, watery bowel movements. She is unable to take in anything p.o. She had a fever of 102.3, and has just been feeling really bad all day. In the ER, she remains with sinus tachycardia, heart rates about 115 to 120. She is still not able to take anything in p.o.    REVIEW OF SYSTEMS:  CONSTITUTIONAL: Positive fever. Positive fatigue. Positive weakness.   EYES:  No blurry or double vision, glaucoma or cataracts. EARS, NOSE, THROAT:  No ear pain, hearing loss, seasonal allergies, post-nasal drip. RESPIRATORY: No cough,   hemoptysis, COPD,  dyspnea.  CARDIOVASCULAR:  No chest pain, orthopnea, edema, arrhythmia, dyspnea on exertion, palpitations, syncope. On telemetry, she is tachycardic. GASTROINTESTINAL: Positive nausea, vomiting and diarrhea. Positive diffuse abdominal pain. No melena or ulcers. She had a recent EGD a couple of weeks ago, which she says was normal.  GENITOURINARY:  Denies any dysuria or hematuria.   ENDOCRINE:  No polyuria or polydipsia. HEMATOLOGIC/LYMPHATIC:  No anemia or easy bruising.  SKIN:  No rash or lesions.  MUSCULOSKELETAL:  She says she is achy all over. No arthritis. Limited extremity exam. NEUROLOGIC:  No known history of CVA, TIA, seizures. She does have migraine headaches.  PSYCHIATRIC:  No history of anxiety or depression.  PAST MEDICAL HISTORY:   Migraines.   MEDICATIONS:  Topamax 100 mg extended release daily.   PAST SURGICAL HISTORY:  Cholecystectomy.   ALLERGIES:  LATEX.   SOCIAL HISTORY:  No tobacco, alcohol or drug use.   FAMILY HISTORY:  No  history of high blood pressure, diabetes or strokes in the family.   PHYSICAL EXAMINATION: VITAL SIGNS: Temperature 99.8, pulse is 115, respirations 18, blood pressure 116/74, 99% on room air.  GENERAL:  The patient is alert, oriented. She just looks very sick.  HEENT: Head is atraumatic. Pupils are round and reactive. Sclerae anicteric. Mucous membranes are dry. Oropharynx is clear.  NECK:  Supple, without JVD, carotid bruit or enlarged thyroid.  CARDIOVASCULAR: Tachycardic, without murmur, gallops or rubs. PMI is not displaced.  LUNGS:  Clear to auscultation, without crackles, rales, rhonchi or wheezing. Normal percussion. Normal symmetrical rise of the chest.  ABDOMEN:  Bowel sounds are positive. Nontender, nondistended. No hepatosplenomegaly. No rebound, guarding or rigidity.  EXTREMITIES: No clubbing, cyanosis or edema.  MUSCULOSKELETAL:No tenderness or effusion.  NEUROLOGIC:  Cranial nerves II to XII are grossly intact. Motor strength 4/4 bilateral upper and lower extremities.   LABORATORIES: Sodium 141, potassium 3.6, chloride 113, bicarb 20, BUN 8, creatinine 0.59, glucose 100, calcium 8.1. Total protein 6.3, albumin 3.7, bilirubin 0.2, alk phos is 59, AST 29, ALT 33, CK 53. White blood cells 7.8, hemoglobin 11.9, hematocrit 35.5, platelets are 249. Urinalysis shows trace bacteria with negative LCE or nitrites.   Telemetry showing sinus tachycardia.   ASSESSMENT AND PLAN: This is a 29 year old female who presents with gastroenteritis with nausea, vomiting and diarrhea.   1.  Gastroenteritis, likely viral in nature. However, I will go ahead and order some stool cultures to evaluate for bacterial pathogens. Will continue with supportive care. She is unable to take  in any p.o. Will try a liquid diet and IV pain medications and antiemetics, along with normal saline.  The patient said that she had an EGD a couple of weeks ago, since she has had a similar presentation a couple of months ago.  Apparently the EGD was normal, and this was done at [facility].  2.  Tachycardia, sinus, secondary to dehydration. The patient had diffuse nausea, vomiting and diarrhea. Although her BUN and creatinine are not elevated, she appears very dry and her tachycardia is coming from hypovolemic state. We will monitor the patient on telemetry and continue IV fluids.   3.  Migraine headaches. Will hold Topamax for now, as the patient is unable to take in p.o.   The patient is FULL CODE STATUS.  Time spent:  Approximately 40 minutes.   ____________________________ Donell Beers. Benjie Karvonen, MD spm:mr D: 05/20/2013 18:54:00 ET T: 05/20/2013 19:21:21 ET JOB#: [id number]  cc: Achilles Neville P. Benjie Karvonen, MD, <Dictator> Vianne Bulls. Arline Asp, MD   Donell Beers Zacaria Pousson MD ELECTRONICALLY SIGNED 05/20/2013 21:50

## 2015-04-21 NOTE — H&P (Signed)
PATIENT NAMESWARA, [name] MR#:  [id number] DATE OF BIRTH:  08-27-1986  DATE OF ADMISSION:  08/18/2013  REFERRING PHYSICIAN:  Dr. Kerman Passey PRIMARY CARE PHYSICIAN: Dr. Arline Asp   CHIEF COMPLAINT: Sore throat.   HISTORY OF PRESENT ILLNESS: The patient is a 29 year old Caucasian female with history of severe migraines with hemiplegia and some neurological manifestations, hospitalization here in May during which the patient had sepsis or SIRS with a viral enteritis, anxiety, depression and narcotics-seeking behavior.  This is all per discharge summary. The patient, of note, has been having sore throat for the past 3 days and has pain when swallowing. The patient went to [facility] and was given Augmentin a few days ago as well as cough medicines. She was told that if the symptoms did not get better to have further work-up, so she came into the hospital today.   The patient states that over the last couple of weeks she has had increased migraine episodes, headaches and nausea  and vomiting. She also has insomnia and difficulty sleeping. A couple of weeks ago she was started on trazodone 100 mg and then bumped up to 200 mg. Her last dose was last night. She also has been taking Unisom for sleep as well as either Tylenol or Advil p.m., per husband, who is also here giving history. She has had no fevers or lymph nodes in the neck. As her symptoms persisted, she came into the hospital today. She has been taking Toradol and Topamax for her headaches as well. Here, she was found to have elevated AST and ALT greater than 1000 with alk phos of 333. Serum acetaminophen level was checked which was not elevated.  Therefore, hospitalist services were contacted for further evaluation and management.   PAST MEDICAL HISTORY:  1.  History of migraines with hemiplegic episodes during which she has occasional loss of vision on the right eye with right-sided numbness.  2.  History of anxiety.  3.  History of  depression.  4.  History of narcotic-seeking behavior per last discharge summary.  5.  History of altered mental status likely secondary to somatization per last discharge summary on 05/25/2013.   PAST SURGICAL HISTORY:  1.  History of cholecystectomy.  2.  A bone graft for a tooth implant.   ALLERGIES: LATEX.   SOCIAL HISTORY: No alcohol, drug or tobacco use. 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   FAMILY HISTORY: Mom with a recently diagnosed intestinal cancer, hypertension. Dad has Barrett's esophagus.   OUTPATIENT MEDICATIONS: Topamax 400 mg at bedtime, Augmentin just started about 2 or 3 days ago, Cheratussin AC 5/10 mL every 4 hours as needed, clonazepam 0.5 mg 1/2 tab 2 times a day, Cymbalta 60 mg take 1-1/2 tab once a day extended-release, promethazine 50 mcg injectable solution 1 mL every 4 to 6 hours as needed for nausea, Seroquel 100 mg at bedtime and 25 mg 3 times a day as needed for headaches, Toradol IM 60 mL 3 times a day as needed for pain, Valium 10 mg 3 times a day.   REVIEW OF SYSTEMS:   CONSTITUTIONAL: Positive for headache currently. No fever, chills or weakness. Positive for fatigue.  EYES: No blurry vision or double vision. EARS, NOSE AND THROAT: Has sore throat without lymph nodes, has some painful swallowing. No snoring or epistaxis.  RESPIRATORY: No cough, wheezing, hemoptysis, shortness of breath, COPD or asthma. No painful respirations.  CARDIOVASCULAR: No chest pain. Swelling in the legs. Occasional  palpitations. No dyspnea on exertion. No hypertension.  GASTROINTESTINAL: Positive for nausea and vomiting off and on where she has to take intramuscular Phenergan every day. Has had diarrhea as well for the past 1-1/2 weeks, multiple episodes, watery, loose. No abdominal pain. No history of hepatitis. No bloody stools or black tarry stools.  GENITOURINARY: Denies dysuria or hematuria.  HEMATOLOGIC/LYMPHATIC:  No anemia or easy bruising.  SKIN: No rashes.   MUSCULOSKELETAL: Denies arthritis or gout.  NEUROLOGICAL:  Has  complicated migraines. No focal weakness or numbness.  PSYCHIATRIC: Has anxiety, insomnia and depression.  PHYSICAL EXAMINATION: VITAL SIGNS: Temperature was 98 on arrival.  Pulse rate initially 120, last pulse of 89. Initial respiratory rate 18, blood pressure 114/72. O2 sat was 98%.  GENERAL: The patient is an ill-appearing Caucasian pale female lying in bed in no obvious distress.  HEAD, EYES, EARS, NOSE, THROAT: Normocephalic, atraumatic. Pupils are equal and reactive. Anicteric sclerae. Extraocular muscles intact. Very dry mucous membranes. Mouth:  There is no tonsillar exudate or bulging.  NECK: Supple. No thyroid tenderness. No cervical lymphadenopathy. No JVD.  CARDIOVASCULAR: S1, S2, regular rate and rhythm. No murmurs, rubs or gallops.  LUNGS: Clear to auscultation. No wheezing, rhonchi or rales.  ABDOMEN: Soft, hyperactive bowel sounds in all quadrants, nontender. No rebound or guarding. No tenderness to deep palpation. No organomegaly appreciated.  EXTREMITIES: No significant lower extremity edema.  SKIN: No obvious rashes. Multiple tattoos.  NEUROLOGIC: Cranial nerves II through XII grossly intact. Strength is 5 out of 5 in all extremities. Sensation is intact to light touch.  PSYCHIATRIC: Anxious, awake, alert, oriented, appears somewhat lethargic.   LABORATORY AND RADIOLOGICAL DATA:  BUN 6, creatinine 0.65, sodium 139, potassium 3.6, glucose 85, serum CO2 is 24, anion gap of 6, lipase 159, albumin 3.3, total bilirubin 0.5. Alk phos 333, AST 1354, ALT is 1266, total protein of 6.6. Troponin negative. TSH 0.87. WBC 5.1, hemoglobin 10.8, platelets are 313. INR is 1, PT is 13.1. UA not suggestive of infection. Serum acetaminophen and salicylates are not elevated. Urine pregnancy test is negative.  Ultrasound of abdomen, limited survey:  Unremarkable right upper quadrant ultrasound.  EKG shows normal sinus rhythm, rate is  100. No acute ST elevations or depressions.   ASSESSMENT AND PLAN: We have a 29 year old female with complicated migraines who has had increased migraines, nausea, vomiting and diarrhea for the past week and a half with a sore throat 2 to 3 days ago, now on Augmentin. She has also had insomnia and was started on trazodone. She came in for persistent sore throat but was found with significantly elevated transaminases with no elevation of bilirubin and mildly elevated alk phos. She appears to be lethargic and dehydrated on exam, as well. She has an ultrasound of the abdomen which is nondiagnostic. At this point, would admit the patient to the hospital and hold all nonessential medications. Would continue the Topamax, but I suspect her transaminitis is possibly medication induced. She has no history of hepatitis. Her last LFTs from a few months ago as well as last set here in the computer from August 5th were normal with normal alk phos, AST and ALT. The only new medication started since then appears to be trazodone and Augmentin.  Augmentin can definitely cause hepatotoxicity. Furthermore, Seroquel at times as well as Klonopin can cause hepatotoxicity. We would hold them all at this point, start the patient on IV fluids. She does not appear to be in any acute hepatic failure with  normal platelets, PT and INR. Would obtain a GI consult and check a hepatitis panel as well as iron TIBC to evaluate for underlying hemachromatosis, but I think that is less likely. We will trend LFTs, and I suspect that they will improve within the next few days. If they do not improve or if GI considers any other testing, we will order them in the next day or so. Other potential etiologies besides viral hepatitis could be autoimmune or hereditary systemic disease-related ideologies. The patient has been taking excessive acetaminophen, however, her level is not elevated. She does not drink either. Furthermore, we would also send an ANA  panel as well as ceruloplasmin. Would start her on morphine and Zofran for symptom relief.  Place her on SCDs and TEDs for DVT prophylaxis.  Start her on a diet.   TOTAL TIME SPENT: 50 minutes.    ____________________________ Vivien Presto, MD sa:cb D: 08/18/2013 20:35:09 ET T: 08/18/2013 21:07:00 ET JOB#: [id number]  cc: Vivien Presto, MD, <Dictator> Vianne Bulls. Arline Asp, MD Vivien Presto MD ELECTRONICALLY SIGNED 09/06/2013 12:05

## 2015-04-21 NOTE — H&P (Signed)
PATIENT NAMLeane [name]:  [name], [name] MR#:  [id number] DATE OF BIRTH:  1986/10/12  DATE OF ADMISSION:  01/20/2013  PRIMARY CARE PHYSICIAN: Reola MosherAndrew S. Randa LynnLamb, MD.   HISTORY OF PRESENT ILLNESS: The patient is a 29 year old Caucasian female with past medical history significant for history of lap cholecystectomy in September of 2013, history of migraine headaches, who presented to the hospital with complaints of nausea, vomiting, diarrhea for the past 5 days. According  to the patient, she was doing well up until approximately 5 days ago when she started having nausea, vomiting, and diarrhea. She has no hematemesis or hematochezia and no significant abdominal pains. However, according to her, she has been running low-grade fevers from 99.5 to 100. She stated that she lives in a household of 5, and all 5 people in her house were having problems with nausea, vomiting, less so with diarrhea, but severity was much less and they were able to get over. Her husband is still having some diarrhea. She is not able to eat or drink; however, she felt somewhat better a few days ago. She ate 1 cracker as well as breadstick; however, she woke up at 2:00 a.m. in the morning the next day and was having severe nausea and vomiting again. She presented yesterday to the hospital. She received 3 liters of crystalloid, after which she was somewhat improved and was discharged home. Now she comes back again because she is not able to tolerate any food.  PAST MEDICAL HISTORY: Significant for history of migraine headaches for which she had trigger point injections approximately 2 weeks ago, history of lap cholecystectomy in September 2013.   MEDICATIONS: Topamax 300 mg at bedtime. She has not been able to take this medication for the past 5 days and she has been complaining of significant headaches.   ALLERGIES: LATEX.  REVIEW OF SYSTEMS: Denies any fever, except as mentioned above, denies any chills, admits of having fatigue and weakness. Admits  of having no pain. Denies any weight loss or gain.  EYES: In regards to eyes, denies any blurry vision, double vision, glaucoma, or cataracts.  ENT: Denies any tinnitus difficulty swallowing.     RESPIRATORY:  No shortness of breath, chest pains, orthopnea. Gastrointestinaly  Admits of nausea, vomiting, diarrhea. Admits to having change in bowel habits.  GENITOURINARY: Denies dysuria, hematuria, frequency, or incontinence.  ENDOCRINOLOGY: Denies any polydipsia, nocturia, thyroid problems, heat or cold intolerance, or thirst.  HEMATOLOGIC: Denies, easy bruising, bleeding or swollen glands.  SKIN: Denies any acne, rashes, moles.  MUSCULOSKELETAL: Denies arthritis,  swelling.  NEUROLOGIC:  No numbness, weakness or tremor.  PSYCHIATRIC: Denies anxiety, insomnia or depression.   PHYSICAL EXAMINATION: VITAL SIGNS: On arrival to the hospital, pulse 108, respiratory rate was 20, blood pressure 105/70, saturation 99% on room air.  GENERAL: This is a well-developed, well-nourished Caucasian female in no significant distress, lying on the stretcher.  HEENT: Her pupils are equal and reactive to light. Extraocular movements intact. No icterus or conjunctivitis. She has normal hearing. No sinus erythema. Mucosa is moist. NECK:  No adenopathy. No JVD or carotid bruits bilaterally. Full range of motion.  LUNGS: Clear to auscultation in all fields. No rales, rhonchi or wheezing.  No labored  inspirations.  No dullness to percussion .Not in overt respiratory distreess.  CARDIOVASCULAR S1, S2 are appreciated. Rhythm is regular. Chest is nontender to palpation, +1 peripheral pulses.  No extremity edema, calf tenderness or cyanosis.  ABDOMEN: Soft, nontender. Bowel sounds are present, however, diminished.  No  splenomegaly, hepatomegaly, no guarding or rebound on palpation. MUSCULOSKELETAL: Able to move all extremities. No cyanosis, degenerative joint disease or kyphosis suggested.  SKIN:  No evidence of rashes,  lesions, erythema, nodularity, induration. It was warm and dry to palpation.  LYMPHATIC: No adenopathy in the cervical region.  NEUROLOGICAL: Cranial nerves grossly intact. Sensory is intact. No dysarthria or aphasia. PSYCHIATRIC: The patient is alert to time, person, place, cooperative. Memory is good. No significant confusion, agitation or depression.   LABORATORY DATA: BMP showed a potassium 3.4. Calcium level is 7.6.  Beta HCG less than 1, elevated AST as well as ALT 55 and 157 respectively. Otherwise unremarkable study. The patient's white blood cell count is normal at 5.1, hemoglobin 11.9, platelet count 268.   No radiologic studies done yet.   ASSESSMENT AND PLAN: 1.  Acute gastroenteritis, likely infectious, questionable bacterial or viral. All family was sick, so will admit the patient to the medical floor for observation and we will have contact precautions. We will get stool cultures taken including nonviral PCR, also continue IV fluids as well as antiemetics.  We are not going to give her any antibiotics for now. We will continue proton pump inhibitors IV.  2.  Dehydration. We will continue IV fluids as above.  3.  Elevated transaminases.  We will get hepatitis panel.  4.  Anemia. We will get guaiac x10.  5. Hypokalemia. Check magnesium level as well as supplement potassium IV. Supplement magnesium as needed.   TIME SPENT: 50 minutes    ____________________________ Katharina Caper, MD rv:cc D: 01/20/2013 15:33:08 ET T: 01/20/2013 17:14:08 ET JOB#: [id number]  cc: Katharina Caper, MD, <Dictator> Reola Mosher. Randa Lynn, MD  Katharina Caper MD ELECTRONICALLY SIGNED 02/18/2013 17:03

## 2015-04-21 NOTE — Discharge Summary (Signed)
PATIENT NAMEAERIS, [name] MR#:  [id number] DATE OF BIRTH:  [birth year]  DATE OF ADMISSION:  05/20/2013 DATE OF DISCHARGE:  05/25/2013  REASON FOR ADMISSION:  Mostly nausea, vomiting and diarrhea.   DIAGNOSES AT DISCHARGE INCLUDE: 1.  Nausea, vomiting, diarrhea, which mostly resolved.  2.  Sepsis or systemic inflammatory response syndrome with tachycardia, fever of 102, altered mental status with normal white blood count.  3.  Possible viral enteritis with coxsackie virus as the patient had kids at home who had mouth-hand-foot disease brought out by coxsackie. 4.  Altered mental status likely due to somatization. 5.  Depression, anxiety. 6.  Tachycardia.  7.  Severe diffuse joint pain.  8.  A history of migraines.  9.  Hypokalemia.  10.  Narcotic-seeking behavior.   IMPORTANT RESULTS:  Glucose was normal, around 90s during the whole hospitalization. BUN was around 3 on admission, 6 at discharge. Creatinine was 0.69 on discharge. Sodium 144, potassium was as low as 2.7, on admission it was 3.0, discharge at 3.2. It was replaced. Chloride it was 113, magnesium 101.6, calcium 7.7, vancomycin trough 10 at discharge. UDS positive for barbiturates, positive for opiates, negative for any other drugs. White blood count:  On admission, white count was 2.8 with a hemoglobin of 10, at discharge 5.4. Her platelets were in the 200s. Her hemoglobin 11.9 at discharge. Differential showed 90% neutrophil. Blood cultures were negative x 2. Stool no Salmonella, no E. coli, no Campylobacter, no red blood cells or white blood cells in the stool. UA shows 1 red blood cells, 2 white blood cells, trace leukocyte esterase., no signs of infection. CRP was elevated at 33. ANA panel was overall negative. Rheumatoid arthritis profile was negative. Rickettsia antibodies were negative. Echo Doppler:  No evidence of endocarditis. All cardiac valves are normal, normal size and function.  CT of the head showed no acute  intracranial findings, presence of intravenous contrast material limits evaluation for intracranial hemorrhage. The patient did not have any signs of bleeding though. Abdomen:  A small amount of free fluid in the pelvis, nonspecific and it might be physiologic due to ovarian cyst rupture. Otherwise no acute findings.   MEDICATIONS AT DISCHARGE:  Topamax 400 mg once a day, promethazine injectable as needed for headaches, Protonix 40 mg daily, Seroquel 50 mg once a day as needed for headache, doxylamine at bedtime for sleeping, Botox injections every 3 months, clorazepate as needed for migraines for rescue, Percocet 7.5/325 every 4 hours needed for pain, clonazepam 0.25 mg twice daily, doxycycline 100 mg every 12 hours for 4 more days, citalopram 20 mg once a day.  FOLLOW UP INSTRUCTIONS:   Followup with Dr. Sharene Skeans in 1 to 2 weeks, and with Dr. Alonna Buckler in 1 to 2 weeks.   HOSPITAL COURSE:  The patient is a 29 year old female who has history of previous hospitalizations for GI complaints. She has a history of migraines and presented with a complaint of nausea, vomiting and diarrhea. Several watery, loose stools, watery bowel movements, could not take anything, and she had a fever or 102.3, feeling really bad, dehydrated, in sinus tachycardia with a heart rate of 115 to 120, unable to tolerate oral fluids or food for what the patient was admitted for evaluation of this. As mentioned above, the patient had different tests done. Unfortunately, this patient had a very difficult hospitalization. The patient was very symptomatic requiring multiple doses of antiemetics and pain medications IV. The patient is still with behaviors that were  confusing. The patient looks very sick, but there was some other type of somatization that the patient was going on. The patient had an episode in which she went to vomit, fell to the floor, became very somnolent and started having some seizure-like activity, complaining of a lot of  pain on her joints. Her seizure-like activity was very concerning for what a CT scan of the head was done stat, not showing any abnormalities. The patient was distractible from this activity. She was able to be brought back and then every time that we talked about something upsetting, she started shaking, rolling her eyes backwards, but again, once you talked to her, touched her or tell her to calm down, all the symptoms resolved and occasionally she went back to it. There was never any postictal state. Neurology consultation was obtained with Dr. Catha NottinghamSelickman, who thought that this was not a seizure, at the beginning was started on fosphenytoin just to be safe despite the fact that the possibility of seizure was very remote and then we stopped it as the patient did not seem to be seizing. The patient developed a fever up to 102, look septic, had tachycardia, had the fever with altered mental status for which she was transferred to the Critical Care Unit. She was found not to have any significant bacterial infection. Urine cultures were negative. Urine and blood cultures were negative. A Rickettsia antibodies were sent. They were negative. An echocardiogram was done to rule out endocarditis and it did not show any significant abnormalities of the valves. I spoke with Dr. Mariah MillingGollan, who did the echocardiogram, and he states that he was able to visualize the valves perfectly and he looked at them very clear for what we did not pursue a TEE. The patient started feeling a little bit better after a couple of days. A psychiatric consultation was done by Dr. Guss Bundehalla, who also thought that there might be a somatization disorder, as the patient has multiple Marital and family  conflicts and depression. She was started on Klonopin and Celexa. The case was discussed with the mother. The patient continued to asked for pain medications and nausea medications, wanted them all IV, having significant seeking behavior. The patient was  discharged on very low dose of pain medications orally, and she was recommended to follow up with her neurologist and her primary care physician. The patient was also told to follow up with Psychiatry and she said that she was going to find a person on her own, her mom was going to help. The patient was discharged in good condition.   TIME SPENT:  I spent about 60 minutes with this discharge on the day of discharge.  ____________________________ Felipa Furnaceoberto Sanchez Gutierrez, MD rsg:jm D: 05/28/2013 13:18:27 ET T: 05/28/2013 13:49:34 ET JOB#: [id number]  cc: Felipa Furnaceoberto Sanchez Gutierrez, MD, <Dictator> Dr. Sharene SkeansHagen, [facility] Reola MosherAndrew S. Randa LynnLamb, MD Regan RakersOBERTO Juanda ChanceSANCHEZ GUTIERRE MD ELECTRONICALLY SIGNED 06/07/2013 1:41

## 2015-04-21 NOTE — Discharge Summary (Signed)
PATIENT NAMLeane [name]:  [name], [name] MR#:  [id number] DATE OF BIRTH:  May 04, 1986  DATE OF ADMISSION:  01/20/2013 DATE OF DISCHARGE:  01/22/2013  PRIMARY CARE PHYSICIAN:  Dr. Randa [name]  DISCHARGE DIAGNOSES: 1.  Acute gastroenteritis.  2.  Leukocytosis.  3.  Dehydration.  4.  Hypokalemia.  5.  Hypomagnesemia.  6.  Anemia.   CONDITION:  Stable.   CODE STATUS:  FULL CODE.   HOME MEDICATIONS:  Topamax 300 mg by mouth daily at bedtime.   DIET:  Regular.  ACTIVITY:  As tolerated.   FOLLOW-UP CARE:  Follow up with PCP as needed.   REASON FOR ADMISSION:  Nausea, vomiting, diarrhea for five days.   HOSPITAL COURSE:  The patient is a 29 year old Caucasian female with a history of lap cholecystectomy last year and migraine headache, presented to the ED with nausea, vomiting, diarrhea for five days.  In addition, patient has a low-grade fever.  For detailed history and physical examination, please refer to the admission note dictated by Dr. Winona LegatoVaickute.  Since patient has intractable nausea, vomiting and diarrhea with dehydration, the patient was admitted for acute gastroenteritis.  After admission patient has been treated with IV fluid support, Zofran as needed.  The patient has hyponatremia and hypomagnesemia which was treated with supplement, has improved.  After above-mentioned treatment, the patient's symptoms have much improved.  She has no nausea, vomiting, diarrhea or abdominal pain.  She is clinically stable, will be discharged to home today.  Discussed the patient's discharge plan with the patient and the patient's father.   TIME SPENT:  About 32 minutes.      ____________________________ Shaune PollackQing Caitlinn Klinker, MD qc:ea D: 01/22/2013 14:50:31 ET T: 01/23/2013 05:40:44 ET JOB#: [id number]  cc: Shaune PollackQing Jceon Alverio, MD, <Dictator> Shaune PollackQING Tamme Mozingo MD ELECTRONICALLY SIGNED 01/23/2013 12:31

## 2015-04-21 NOTE — Consult Note (Signed)
PATIENT NAMLeane [name]:  [name], [name] MR#:  [id number] DATE OF BIRTH:  03-21-1986  INITIAL CONSULTATION  DATE OF CONSULTATION:  05/23/2013  CONSULTING PHYSICIAN:  Pauletta BrownsYuriy Daria Mcmeekin, MD  REASON FOR CONSULTATION: Rule out seizure activity.   This is a 29 year old female with a past medical history of migraines, [facility], presenting with nausea, vomiting, diarrhea, loose bowel movements, and fevers, suspected to be gastroenteritis. Started on antibiotics.   REASON FOR NEUROLOGICAL CONSULTATION: Yesterday during the day the patient was in the bathroom and then slumped over, slid down, and there were noted to be occasional jerking movements of her upper extremities, with her eyes closed, which soon resolved after this episode. The patient was able to converse with her mom, but she currently states she does not remember an episode. Also late in the evening the patient contracted her upper extremities, tried to verbalize, but had difficulty doing so. That lasted for a few minutes and then the patient came back to her normal self. No urinary incontinence. No fecal incontinence. No tongue-biting. The patient has no history of a seizure in the past and no family history.   REVIEW OF SYSTEMS: Currently positive fevers. No blurry vision. No ear pain. No hearing loss.  RESPIRATORY: No cough. No hemoptysis.  CARDIOVASCULAR: No chest pain. No orthopnea.  GASTROINTESTINAL: Positive nausea, vomiting and diarrhea.  MUSCULOSKELETAL: Diffuse pain.  PSYCHIATRIC: Appears to have a history of anxiety.   PAST SURGICAL HISTORY: Cholecystectomy.   ALLERGIES: LATEX.   No tobacco, alcohol or drug use.   FAMILY HISTORY: Noncontributory. No history of seizures in the family.   RADIOLOGY: CAT scan of the head obtained. No acute intracranial abnormality was found.   PHYSICAL EXAMINATION: VITAL SIGNS: The patient's temperature is 98.2, pulse is 90, respirations 16, 98/64 as the blood pressure, pulse  oximetry 97% on room air.   The patient appears to be anxious. Able to follow 2-step commands. Able to repeat 3-word sentences after me. Speech appears to be fluent. Visual fields intact. Pupils 3 mm, 2 mm, reactive bilaterally. Facial sensation is intact. Facial motor is intact. Tongue is midline. Uvula elevates symmetrically. Motor strength is 5-/5, bilateral upper and lower extremities. Reflexes  2+ symmetrical. Sensation intact to light touch and proprioception. Coordination intact, finger-to-nose. Gait not assessed.   IMPRESSION: This is a 29 year old female presenting with suspected gastroenteritis, nausea, vomiting, diarrhea. She was started on broad-spectrum antibiotics. Echocardiogram is pending for possibility of endocarditis. The patient is status post 2 episodes. One was suspected to be convulsive syncope. Patient passed out. Had shaking of the limbs briefly. Did not sound like tonic-clonic activity. The patient was able to stand up post that episode last night. The patient had an episode contracture of bilateral wrists  described, by the mom, lasting what appeared to be a few minutes. The patient tried to converse through these episodes.   When conducting an interview the patient was observed to be closing her eyes and having occasional twitching of her face and bilateral arms. That twitching  did not appear to be rhythmic. When evaluating her heart during these episodes the patient closed her eyes tight and  I had difficulty prying them open. At the same time, these episodes were distractible and when the twitching briefly resolved the patient was back to her normal self.   PLAN: I do not suspect this is seizure activity. Possibility of conversion disorder. At this point  I would not obtain an EEG or MRI. Would not  treat with her antiepileptic medications. I would call pharmacy to get a renal dose of the Topamax as she is taking for migraines and place her back on that dose as there is a  possibility of her withdrawing from it, and that could result in a seizure, but I believe these current episodes are not epileptic seizure activity.   The rest of her treatment per primary team. The case was discussed with Dr. Mordecai Maes.   Thank you. It was a pleasure seeing this patient.    ____________________________ Pauletta Browns, MD yz:dm D: 05/23/2013 12:58:42 ET T: 05/23/2013 14:25:53 ET JOB#: [id number]  cc: Pauletta Browns, MD, <Dictator> Pauletta Browns MD ELECTRONICALLY SIGNED 05/28/2013 14:39

## 2015-04-21 NOTE — Consult Note (Signed)
PATIENT NAME:  [name], [name] MR#:  [id number] DATE OF BIRTH:  May 09, 1986  DATE OF CONSULTATION:  08/19/2013  REFERRING PHYSICIAN:  Abel Presto, MD CONSULTING PROVIDER:  Andria Meuse, NP GASTROENTEROLOGIST: Lucilla Lame, MD.  PRIMARY CARE PHYSICIAN: Apolonio Schneiders, MD  REASON FOR CONSULTATION: Elevated LFTs.   HISTORY OF PRESENT ILLNESS: [name] is a 29 year old Caucasian female with history of disabling severe migraines with neurologic changes including hemiplegia. She also has history of anxiety, depression, and narcotic-seeking behavior.   We were consulted for elevated transaminases. Her alkaline phosphatase was 333 yesterday, 279 today, AST 1354 yesterday and 407 today, and ALT 1266 yesterday and 779 today. Her total bilirubin has been normal. Her albumin is 2.9 and her total protein is 6.   She had another episode of elevated LFTs back in January 2014 with headaches as well. Her alk  phos was 217, AST 202, and ALT 204.   She is seeing a neurologist, and recently had her medications changed. Her Cymbalta was changed to 90 mg; otherwise, no new medications.   She denies any fever or chills. She denies any history of liver disease. Her weight has been stable. Her appetite has been fine. She denies any rectal bleeding or melena. She did have loose diarrheal stools, nausea and vomiting for the last 10 days or so. She denies any significant Tylenol use, although she does take Tylenol or Advil PM on a p.r.n. basis. She also takes Toradol for her headaches. She did receive Botox injections two days ago. She has 8 tattoos. She has never had a blood transfusion. She denies any history of viral hepatitis.   Her hemoglobin was 10.8 on admission, 10.4 today. Her MCV is normal. Her white blood cell count and platelets are normal. Salicylate is less than 1.7. Acetaminophen less than 2. She had a right upper quadrant ultrasound which was benign. It did show a 7.7 mm common bile duct and she is status post  cholecystectomy.   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 She is status post cholecystectomy, a bone graft for acute implant.   MEDICATIONS PRIOR TO ADMISSION: Augmentin 875/125 mg every 12 hours; Cheratussin AC 10/100 mg per 5 mL syrup, 5 to 10 mL q.4 hours; clonazepam 0.5 mg b.i.d.; Cymbalta 60 mg one and a half daily; promethazine 50 mg/mL, 1 mL injected every 4 to 6 hours as needed for nausea and vomiting; Seroquel 100 mg at bedtime; Seroquel 25 mg t.i.d. p.r.n. headaches; Topamax 200 mg two tablets once daily at bedtime; Toradol IM 60 mg intramuscularly 3 times a day as needed for pain; Valium 10 mg t.i.d. p.r.n.   ALLERGIES: LATEX CAUSES RASH.   FAMILY HISTORY: Mom was recently diagnosed with a GI cancer. The patient is unsure whether this is colon or small bowel, and she states that her mom is dealing with diagnosis currently. Hypertension. Her father had Barrett esophagus.   SOCIAL HISTORY: She denies any tobacco, alcohol or illicit drug use. She stays at home with her children, ages 51, 1,  and 20. She is married.   REVIEW OF SYSTEMS:  NEUROLOGICAL: Visual changes with her headaches.  HEENT: She complains of sore throat and pain with swallowing.   PSYCHIATRIC: She had depressed feelings, anxiety, insomnia.  Otherwise,  negative 12-point review of systems.   PHYSICAL EXAMINATION:  VITAL SIGNS: Temperature 97.6, pulse 98, respirations 18, blood  pressure 101/57, oxygen saturation 96% on room air.  GENERAL: Tearful, alert, pleasant, cooperative, in no acute distress.  HEENT: Sclerae clear, anicteric. Conjunctivae pink. Oropharynx pink and moist without any lesions.  NECK: Supple without mass or thyromegaly.  CHEST: Heart regular rate and rhythm. Normal S1, S2, without murmurs, clicks, rubs or gallops.   LUNGS: Clear to auscultation bilaterally.  ABDOMEN: Positive bowel sounds x4. No bruits auscultated. Abdomen is soft, nontender, nondistended, without palpable mass or hepatosplenomegaly. No rebound tenderness or guarding.  EXTREMITIES: Without clubbing or edema.  SKIN: Pink, warm and dry without any rash or jaundice.  PSYCHIATRIC: Flat affect. Appears tearful. Depressed mood.  NEUROLOGIC: Grossly intact.  MUSCULOSKELETAL: Good equal strength and movement bilaterally.   LABORATORY STUDIES: Glucose 102, creatinine 0.59, chloride 111, calcium 8, BUN 15;  otherwise, normal met-7. Iron 74, TSH 0.318, troponin negative. INR 1.1. Urine pregnancy negative. Mononucleosis negative.   IMPRESSION: [name] is a 29 year old Caucasian female with past history significant for severe, disabling migraine headaches with neurologic old manifestations. She denies any abdominal pain.   Her alk phos was elevated at 333 yesterday, 279 today; AST 1354 yesterday, 407 today; and ALT 12. 66 today and 779 yesterday. She had a right upper quadrant ultrasound which shows a CBD of 7.7 mm status post cholecystectomy, which is physiologic, and a normal liver.   Her acetaminophen levels were normal. She denies any illicit drug use or over-the-counter herbal medications. One other documented episode of transaminitis January 2014 with alk phos 217,  AST 202, and ALT 204, respectively. This was associated with headaches as well. She will need complete serologic hepatic workup to rule out liver disease including autoimmune disease, Wilson disease, alpha-1antitrypsin deficiency, less likely acute viral hepatitis or other liver diseases.   She also reports diarrhea, and we are awaiting full set of stool studies too.   PLAN:  1.  Follow-up ANA, ceruloplasmin, and iron.  2.  Antimitochondrial antibodies, immunoglobulins, alpha-I antitrypsin, anti-smooth muscle antibody.  3.  Follow-up stool studies.  4.  Supportive measures.  5.   Urine drug screen.   Thank you for allowing [place] to participate in the care of [name].   These services provided by Vickey Huger, NP, under collaborative agreement with Dr. Lucilla Lame.   ____________________________ Andria Meuse, NP klj:np D: 08/19/2013 13:57:44 ET T: 08/19/2013 15:25:32 ET JOB#: [id number]  cc: Andria Meuse, NP, <Dictator> Vianne Bulls. Arline Asp, MD  [place] ELECTRONICALLY SIGNED 09/13/2013 13:09

## 2016-01-28 ENCOUNTER — Emergency Department (HOSPITAL_COMMUNITY)
Admission: EM | Admit: 2016-01-28 | Discharge: 2016-01-29 | Disposition: A | Discharge status: Home / Self Care | Payer: BC Managed Care – PPO | Attending: Emergency Medicine | Admitting: Emergency Medicine

## 2016-01-28 ENCOUNTER — Encounter (HOSPITAL_COMMUNITY): Payer: Self-pay | Admitting: *Deleted

## 2016-01-28 ENCOUNTER — Emergency Department (HOSPITAL_COMMUNITY): Payer: BC Managed Care – PPO

## 2016-01-28 DIAGNOSIS — G43909 Migraine, unspecified, not intractable, without status migrainosus: Secondary | ICD-10-CM | POA: Insufficient documentation

## 2016-01-28 DIAGNOSIS — M549 Dorsalgia, unspecified: Secondary | ICD-10-CM | POA: Insufficient documentation

## 2016-01-28 DIAGNOSIS — Z3202 Encounter for pregnancy test, result negative: Secondary | ICD-10-CM | POA: Insufficient documentation

## 2016-01-28 DIAGNOSIS — Z91040 Latex allergy status: Secondary | ICD-10-CM | POA: Insufficient documentation

## 2016-01-28 DIAGNOSIS — Z9049 Acquired absence of other specified parts of digestive tract: Secondary | ICD-10-CM | POA: Diagnosis not present

## 2016-01-28 DIAGNOSIS — K2980 Duodenitis without bleeding: Secondary | ICD-10-CM

## 2016-01-28 DIAGNOSIS — Z79899 Other long term (current) drug therapy: Secondary | ICD-10-CM | POA: Diagnosis not present

## 2016-01-28 DIAGNOSIS — R1013 Epigastric pain: Secondary | ICD-10-CM | POA: Diagnosis present

## 2016-01-28 DIAGNOSIS — Z87891 Personal history of nicotine dependence: Secondary | ICD-10-CM | POA: Insufficient documentation

## 2016-01-28 LAB — COMPREHENSIVE METABOLIC PANEL
ALT: 20 U/L (ref 14–54)
AST: 18 U/L (ref 15–41)
Albumin: 4.5 g/dL (ref 3.5–5.0)
Alkaline Phosphatase: 42 U/L (ref 38–126)
Anion gap: 14 (ref 5–15)
BUN: 9 mg/dL (ref 6–20)
CHLORIDE: 107 mmol/L (ref 101–111)
CO2: 19 mmol/L — AB (ref 22–32)
CREATININE: 0.79 mg/dL (ref 0.44–1.00)
Calcium: 9.5 mg/dL (ref 8.9–10.3)
GFR calc Af Amer: >60 mL/min (ref >60)
Glucose, Bld: 102 mg/dL — ABNORMAL HIGH (ref 65–99)
Potassium: 4.2 mmol/L (ref 3.5–5.1)
SODIUM: 140 mmol/L (ref 135–145)
Total Bilirubin: 0.1 mg/dL — ABNORMAL LOW (ref 0.3–1.2)
Total Protein: 7.2 g/dL (ref 6.5–8.1)

## 2016-01-28 LAB — URINE MICROSCOPIC-ADD ON

## 2016-01-28 LAB — URINALYSIS, ROUTINE W REFLEX MICROSCOPIC
GLUCOSE, UA: NEGATIVE mg/dL
HGB URINE DIPSTICK: NEGATIVE
KETONES UR: NEGATIVE mg/dL
Nitrite: NEGATIVE
PROTEIN: NEGATIVE mg/dL
Specific Gravity, Urine: 1.036 — ABNORMAL HIGH (ref 1.005–1.030)
pH: 6 (ref 5.0–8.0)

## 2016-01-28 LAB — CBC
HCT: 43.3 % (ref 36.0–46.0)
Hemoglobin: 14.2 g/dL (ref 12.0–15.0)
MCH: 32.1 pg (ref 26.0–34.0)
MCHC: 32.8 g/dL (ref 30.0–36.0)
MCV: 98 fL (ref 78.0–100.0)
PLATELETS: 422 10*3/uL — AB (ref 150–400)
RBC: 4.42 MIL/uL (ref 3.87–5.11)
RDW: 13.4 % (ref 11.5–15.5)
WBC: 14.5 10*3/uL — ABNORMAL HIGH (ref 4.0–10.5)

## 2016-01-28 LAB — I-STAT BETA HCG BLOOD, ED (MC, WL, AP ONLY): I-stat hCG, quantitative: <5 m[IU]/mL (ref <5)

## 2016-01-28 LAB — LIPASE, BLOOD: LIPASE: 55 U/L — AB (ref 11–51)

## 2016-01-28 MED ORDER — SUCRALFATE 1 G PO TABS
1.0000 g | ORAL_TABLET | Freq: Once | ORAL | Status: AC
  Administered 2016-01-28: 1 g via ORAL
  Filled 2016-01-28: qty 1

## 2016-01-28 MED ORDER — OXYCODONE-ACETAMINOPHEN 5-325 MG PO TABS
ORAL_TABLET | ORAL | Status: AC
  Filled 2016-01-28: qty 1

## 2016-01-28 MED ORDER — MORPHINE SULFATE (PF) 4 MG/ML IV SOLN
4.0000 mg | Freq: Once | INTRAVENOUS | Status: AC
  Administered 2016-01-28: 4 mg via INTRAVENOUS
  Filled 2016-01-28: qty 1

## 2016-01-28 MED ORDER — PANTOPRAZOLE SODIUM 40 MG IV SOLR
40.0000 mg | Freq: Once | INTRAVENOUS | Status: AC
  Administered 2016-01-28: 40 mg via INTRAVENOUS
  Filled 2016-01-28: qty 40

## 2016-01-28 MED ORDER — OXYCODONE-ACETAMINOPHEN 5-325 MG PO TABS
1.0000 | ORAL_TABLET | Freq: Once | ORAL | Status: AC
  Administered 2016-01-28: 1 via ORAL

## 2016-01-28 MED ORDER — GI COCKTAIL ~~LOC~~
30.0000 mL | Freq: Once | ORAL | Status: AC
  Administered 2016-01-28: 30 mL via ORAL
  Filled 2016-01-28: qty 30

## 2016-01-28 MED ORDER — METOCLOPRAMIDE HCL 5 MG/ML IJ SOLN
10.0000 mg | Freq: Once | INTRAMUSCULAR | Status: AC
  Administered 2016-01-28: 10 mg via INTRAVENOUS
  Filled 2016-01-28: qty 2

## 2016-01-28 MED ORDER — IOHEXOL 300 MG/ML  SOLN
100.0000 mL | Freq: Once | INTRAMUSCULAR | Status: AC | PRN
  Administered 2016-01-28: 100 mL via INTRAVENOUS

## 2016-01-28 MED ORDER — SODIUM CHLORIDE 0.9 % IV BOLUS (SEPSIS)
500.0000 mL | Freq: Once | INTRAVENOUS | Status: AC
  Administered 2016-01-28: 500 mL via INTRAVENOUS

## 2016-01-28 NOTE — ED Provider Notes (Signed)
CSN: [id number]     Arrival date & time 01/28/16  1727 History   First MD Initiated Contact with Patient 01/28/16 2006     Chief Complaint  Patient presents with  . Abdominal Pain  . Back Pain     (Consider location/radiation/quality/duration/timing/severity/associated sxs/prior Treatment) HPI Comments: Patient with history of cholecystectomy in 2014 presents with sharp, severe epigastric pain radiating to back and somewhat to chest. She has experienced nausea with bilious emesis that is non-bloody. No fever. No change in bowel movements - no constipation or diarrhea. No melena. She reports that it feels the same as when she needed to have her gall bladder removed. No aggravating or alleviating factors. No regular or daily use of NSAIDs. She states she was diagnosed with a urinary tract infection that did not resolve with zithromax. She was given "a shot" of antibiotics last week and has not been rechecked since. She denies urinary symptoms.  Patient is a 30 y.o. female presenting with abdominal pain and back pain. The history is provided by the patient. No language interpreter was used.  Abdominal Pain Pain location:  Epigastric Pain quality: sharp   Pain radiates to:  Back Pain severity:  Severe Duration:  2 days Timing:  Constant Chronicity:  New Relieved by:  Nothing Worsened by:  Nothing tried Ineffective treatments:  NSAIDs, heat and acetaminophen Associated symptoms: nausea and vomiting   Associated symptoms: no chills, no constipation, no diarrhea, no dysuria and no fever   Back Pain Associated symptoms: abdominal pain   Associated symptoms: no dysuria and no fever     Past Medical History  Diagnosis Date  . Migraine     hemiplegic  . Migraine    Past Surgical History  Procedure Laterality Date  . Cholecystectomy  2013  . Dental surgery     Family History  Problem Relation Age of Onset  . Cancer - Other Mother 49  . Hypertension Mother   . Hypertension Father    . Breast cancer Maternal Grandmother    Social History  Substance Use Topics  . Smoking status: Former Smoker -- 1.00 packs/day for 5 years  . Smokeless tobacco: Never Used  . Alcohol Use: Yes     Comment: 4/week   OB History    Gravida Para Term Preterm AB TAB SAB Ectopic Multiple Living   3         3      Obstetric Comments   Age first pregnancy 18     Review of Systems  Constitutional: Negative for fever and chills.  HENT: Negative.   Respiratory: Negative.   Cardiovascular: Negative.   Gastrointestinal: Positive for nausea, vomiting and abdominal pain. Negative for diarrhea and constipation.  Genitourinary: Negative for dysuria and frequency.  Musculoskeletal: Positive for back pain.  Skin: Negative.   Neurological: Negative.       Allergies  Latex  Home Medications   Prior to Admission medications   Medication Sig Start Date End Date Taking? Authorizing Provider  diphenhydramine-acetaminophen (TYLENOL PM) 25-500 MG TABS tablet Take 2 tablets by mouth at bedtime as needed (sleep).   Yes Historical Provider, MD  OVER THE COUNTER MEDICATION Take 1-2 tablets by mouth at bedtime as needed (sleep). OTC Unisom   Yes Historical Provider, MD  topiramate (TOPAMAX) 100 MG tablet Take 200 mg by mouth at bedtime.   Yes Historical Provider, MD  promethazine (PHENERGAN) 50 MG/ML injection Inject 1 mL (50 mg total) into the muscle every 6 (six) hours  as needed for nausea or vomiting. Patient not taking: Reported on 01/28/2016 01/16/15   Butch Penny, NP   BP 108/87 mmHg  Pulse 110  Temp(Src) 98.1 F (36.7 C) (Oral)  Resp 20  Ht  (1.626 m)  Wt 66.134 kg  BMI 25.01 kg/m2  SpO2 99%  LMP 01/10/2016 Physical Exam  Constitutional: She is oriented to person, place, and time. She appears well-developed and well-nourished.  Uncomfortable appearing.  HENT:  Head: Normocephalic.  Neck: Normal range of motion. Neck supple.  Cardiovascular: Normal rate and regular rhythm.    Pulmonary/Chest: Effort normal and breath sounds normal.  Abdominal: Soft. Bowel sounds are normal. There is tenderness. There is no rebound and no guarding.  Epigastric tenderness to soft abdomen.   Musculoskeletal: Normal range of motion.  Neurological: She is alert and oriented to person, place, and time.  Skin: Skin is warm and dry. No rash noted.  Psychiatric: She has a normal mood and affect.    ED Course  Procedures (including critical care time) Labs Review Labs Reviewed  LIPASE, BLOOD - Abnormal; Notable for the following:    Lipase 55 (*)    All other components within normal limits  COMPREHENSIVE METABOLIC PANEL - Abnormal; Notable for the following:    CO2 19 (*)    Glucose, Bld 102 (*)    Total Bilirubin 0.1 (*)    All other components within normal limits  CBC - Abnormal; Notable for the following:    WBC 14.5 (*)    Platelets 422 (*)    All other components within normal limits  URINALYSIS, ROUTINE W REFLEX MICROSCOPIC (NOT AT [facility]) - Abnormal; Notable for the following:    APPearance TURBID (*)    Specific Gravity, Urine 1.036 (*)    Bilirubin Urine SMALL (*)    Leukocytes, UA MODERATE (*)    All other components within normal limits  URINE MICROSCOPIC-ADD ON - Abnormal; Notable for the following:    Squamous Epithelial / LPF 6-30 (*)    Bacteria, UA FEW (*)    All other components within normal limits  I-STAT BETA HCG BLOOD, ED ([facility], [facility], [facility] ONLY)   Results for orders placed or performed during the hospital encounter of 01/28/16  Lipase, blood  Result Value Ref Range   Lipase 55 (H) 11 - 51 U/L  Comprehensive metabolic panel  Result Value Ref Range   Sodium 140 135 - 145 mmol/L   Potassium 4.2 3.5 - 5.1 mmol/L   Chloride 107 101 - 111 mmol/L   CO2 19 (L) 22 - 32 mmol/L   Glucose, Bld 102 (H) 65 - 99 mg/dL   BUN 9 6 - 20 mg/dL   Creatinine, Ser 1.61 0.44 - 1.00 mg/dL   Calcium 9.5 8.9 - 09.6 mg/dL   Total Protein 7.2 6.5 - 8.1 g/dL   Albumin 4.5 3.5  - 5.0 g/dL   AST 18 15 - 41 U/L   ALT 20 14 - 54 U/L   Alkaline Phosphatase 42 38 - 126 U/L   Total Bilirubin 0.1 (L) 0.3 - 1.2 mg/dL   GFR calc non Af Amer >60 >60 mL/min   GFR calc Af Amer >60 >60 mL/min   Anion gap 14 5 - 15  CBC  Result Value Ref Range   WBC 14.5 (H) 4.0 - 10.5 K/uL   RBC 4.42 3.87 - 5.11 MIL/uL   Hemoglobin 14.2 12.0 - 15.0 g/dL   HCT 04.5 40.9 - 81.1 %  MCV 98.0 78.0 - 100.0 fL   MCH 32.1 26.0 - 34.0 pg   MCHC 32.8 30.0 - 36.0 g/dL   RDW 16.1 09.6 - 04.5 %   Platelets 422 (H) 150 - 400 K/uL  Urinalysis, Routine w reflex microscopic (not at [facility])  Result Value Ref Range   Color, Urine YELLOW YELLOW   APPearance TURBID (A) CLEAR   Specific Gravity, Urine 1.036 (H) 1.005 - 1.030   pH 6.0 5.0 - 8.0   Glucose, UA NEGATIVE NEGATIVE mg/dL   Hgb urine dipstick NEGATIVE NEGATIVE   Bilirubin Urine SMALL (A) NEGATIVE   Ketones, ur NEGATIVE NEGATIVE mg/dL   Protein, ur NEGATIVE NEGATIVE mg/dL   Nitrite NEGATIVE NEGATIVE   Leukocytes, UA MODERATE (A) NEGATIVE  Urine microscopic-add on  Result Value Ref Range   Squamous Epithelial / LPF 6-30 (A) NONE SEEN   WBC, UA TOO NUMEROUS TO COUNT 0 - 5 WBC/hpf   RBC / HPF 0-5 0 - 5 RBC/hpf   Bacteria, UA FEW (A) NONE SEEN  I-Stat beta hCG blood, ED ([facility], [facility], [facility] only)  Result Value Ref Range   I-stat hCG, quantitative <5.0 <5 mIU/mL   Comment 3           Ct Abdomen Pelvis W Contrast  01/28/2016  CLINICAL DATA:  Nausea and epigastric pain for 1 week. EXAM: CT ABDOMEN AND PELVIS WITH CONTRAST TECHNIQUE: Multidetector CT imaging of the abdomen and pelvis was performed using the standard protocol following bolus administration of intravenous contrast. CONTRAST:  OMNIPAQUE IOHEXOL 300 MG/ML  SOLN COMPARISON:  12/20/2013 FINDINGS: Lower chest and abdominal wall: Bilateral gluteal subcutaneous scarring. Hepatobiliary: Wispy hypervascularity in the right liver on 2:16 is likely perfusion anomaly.Cholecystectomy with chronic  intra and extrahepatic bile duct enlargement attributed reservoir effects. Pancreas: Edema around the uncinate process and head without parenchymal expansion or interstitial edema. Spleen: Unremarkable. Adrenals/Urinary Tract: Negative adrenals. No hydronephrosis or stone. Unremarkable bladder. Reproductive:No pathologic findings. Stomach/Bowel: Circumferential edema around the proximal duodenum with regional mild adenopathy. As confirmed on reformats, no extraluminal gas. No notable bowel wall thickening. No obstruction. No appendicitis. Vascular/Lymphatic: No acute vascular abnormality. Peritoneal: No ascites or pneumoperitoneum. Musculoskeletal: No acute abnormalities. IMPRESSION: 1. Right upper quadrant inflammation, favor peptic ulcer disease over groove pancreatitis. 2. Cholecystectomy with chronic biliary dilatation. Electronically Signed   By: Marnee Spring M.D.   On: 01/28/2016 22:30    Imaging Review No results found. I have personally reviewed and evaluated these images and lab results as part of my medical decision-making.   EKG Interpretation None      MDM   Final diagnoses:  None    1. Duodenitis  Patient with severe epigastric pain, nausea, vomiting (non-bloody) x 2 days. Normal stools, no fever. CT scan reveals likely peptic ulcer dx/duodenitis. Minimal elevation in lipase, ? Groove pancreatitis on CT. Discussed with radiology, favor duodenitis/peptic ulcer dz.   Pain is improved by not resolved. She has received Protonix, Morphine, Reglan, Carafate and pain decreased from 10/10 to 6/10. Admission offered for pain control and further evaluation/management, including GI consultation. Patient declines admission. Will refer to GI outpatient. Home with protonix, carafate, percocet. Return precautions discussed. Patient discharged home.     Elpidio Anis, PA-C 01/29/16 0023  Gerhard Munch, MD 01/31/16 [phone]

## 2016-01-28 NOTE — ED Notes (Signed)
Pt reports onset two days ago of right side rib pain into her back and down into her abd. Having n/v and unable to get comfortable.

## 2016-01-29 MED ORDER — METOCLOPRAMIDE HCL 10 MG PO TABS: 10.0000 mg | ORAL_TABLET | Freq: Four times a day (QID) | ORAL | Status: DC

## 2016-01-29 MED ORDER — OXYCODONE-ACETAMINOPHEN 5-325 MG PO TABS: 1.0000 | ORAL_TABLET | ORAL | Status: DC | PRN

## 2016-01-29 MED ORDER — PANTOPRAZOLE SODIUM 20 MG PO TBEC: 20.0000 mg | DELAYED_RELEASE_TABLET | Freq: Two times a day (BID) | ORAL | Status: DC

## 2016-01-29 MED ORDER — SUCRALFATE 1 G PO TABS: 1.0000 g | ORAL_TABLET | Freq: Three times a day (TID) | ORAL | Status: DC

## 2016-01-29 NOTE — Discharge Instructions (Signed)
Duodenitis °Duodenitis is inflammation of the lining of the first part of your small intestine (duodenum). There are two types of duodenitis: °· Acute duodenitis (develops suddenly and is short lived). °·  Chronic duodenitis (develops over an extended period and lasts months to years). °CAUSES  °Duodenitis is most often caused by infection with the bacterium Helicobacter pylori (H. pylori). H. pylori increases the production of stomach acid and causes changes in the environment of the duodenum. This irritates and damages the cells of the duodenum causing inflammation. °Other causes of duodenitis include:  °· Long-term use of nonsteroidal anti-inflammatory drugs (NSAIDs). NSAIDs change the lining of the duodenum and make it more prone to injury from stomach acid. °· Excessive use of alcohol. Alcohol increases stomach acid and changes the lining of the duodenum which makes it more likely for inflammation to develop. °· Giardiasis. Giardiasis is a common infection of the small intestine. It can cause inflammation of the duodenum.   °· Other gastrointestinal disorders, such as Crohn disease. People with these disorders are more likely to develop duodenitis. °SYMPTOMS  °Although duodenitis does not always cause symptoms, symptoms that do occur include: °· Nausea or vomiting. °· Gassy, bloated feeling or an uncomfortable feeling of fullness after eating. °· Burning, cramps, or pain in the upper abdominal area. °DIAGNOSIS  °To diagnose duodenitis, your health care provider may use results from:  °· An exam of the duodenum using a thin tube with a tiny camera on the tip, which is placed down your throat (endoscope). The endoscope is passed through your stomach and into your duodenum. Sometimes a sample of tissue from your duodenum is removed with the endoscope. The sample is then examined under a microscope (biopsy) for signs of inflammation and H. pylori infection.   °· Tests that check samples of your blood or stool for  H. pylori infection.   °· A test that checks the gases in a sample of your expired breath for H. pylori infection. The test measures the levels of carbon dioxide in your breath after you drink a special solution. °· An X-ray exam using a special liquid that you swallow to illuminate your digestive tract (barium) to show signs of inflammation. °TREATMENT  °Treatment will depend on the cause of the duodenitis. The most common treatments include: °· Use of medication to treat infection. °· Medication to reduce stomach acid. °· Discontinuing the use of NSAIDs. °· Management of other gastrointestinal conditions. °· Avoiding alcohol consumption. °Additionally, taking the following steps can help to reduce the severity of your symptoms: °· Drink enough water to keep your urine clear or pale yellow. °· Avoid consuming these foods or drinks: °¨ Caffeinated drinks. °¨ Chocolate. °¨ Peppermint or mint-flavored food or drinks. °¨ Garlic. °¨ Onions. °¨ Spicy foods. °¨ Citrus fruits, such as oranges, lemons, or limes. °¨ Foods that use tomato-based sauces, such as pasta sauce, chili, salsa, and pizza. °¨ Fatty foods. °¨ Fried foods. °  °This information is not intended to replace advice given to you by your health care provider. Make sure you discuss any questions you have with your health care provider. °  °Document Released: 04/12/2013 Document Revised: 01/06/2015 Document Reviewed: 04/12/2013 °[employer]vier Interactive Patient Education ©2016 [employer] ° °

## 2016-01-29 NOTE — ED Notes (Signed)
Sleeping soundly.  Woke up to assess pain.  Reports 6/10 at this time.

## 2017-02-26 IMAGING — CT CT ABD-PELV W/ CM
2 of 4 series · 16 of 46 positions shown, 18 images · IV contrast (APPLIED)
Comparison: 12/20/2013

CLINICAL DATA: Nausea and epigastric pain for 1 week.

EXAM:
CT ABDOMEN AND PELVIS WITH CONTRAST
TECHNIQUE: Multidetector CT imaging of the abdomen and pelvis was performed
using the standard protocol following bolus administration of
intravenous contrast.
CONTRAST:  100mL OMNIPAQUE IOHEXOL 300 MG/ML  SOLN

[Series 2: abd/ pelvis 5.0 i30f 1 · axial · 0.66mm/px · z∈[-493,-78]mm · 13 of 91 slices shown, 15 images]
[im 4/91  soft-tissue]
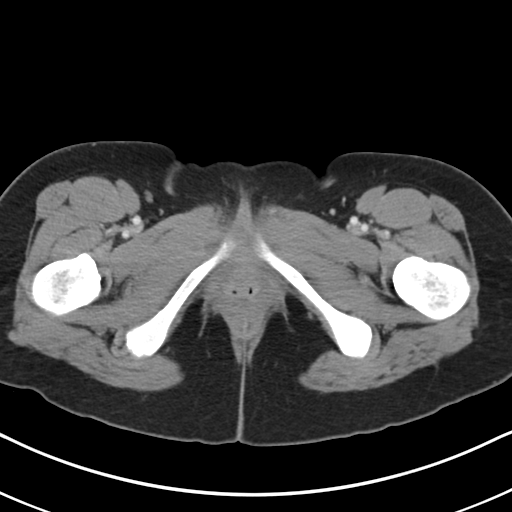
[im 4/91  bone]
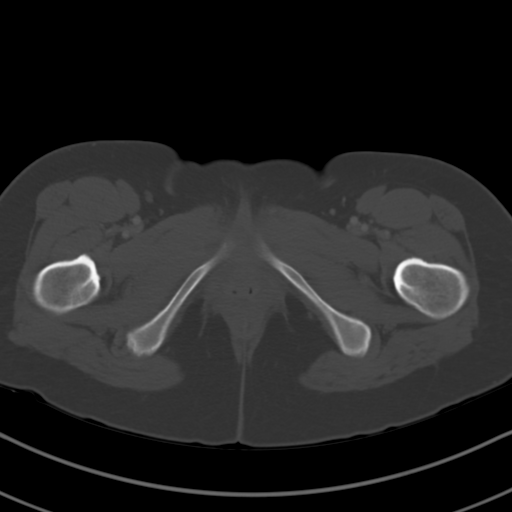
[im 11/91  soft-tissue]
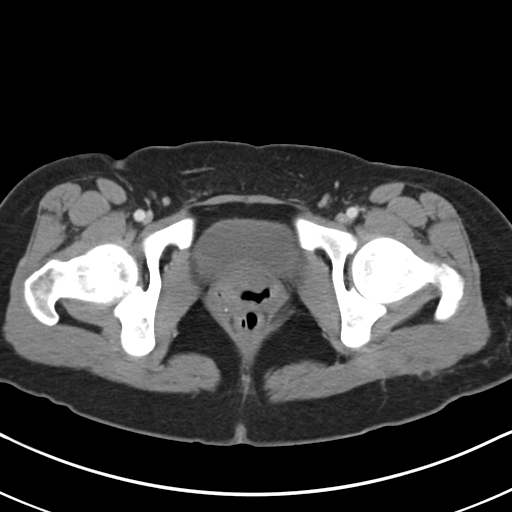
[im 18/91  soft-tissue]
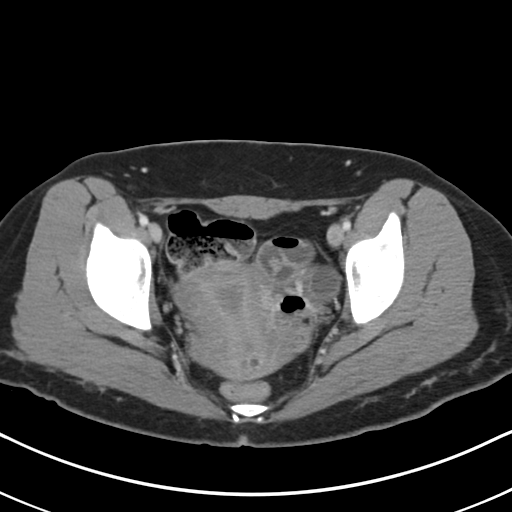
[im 25/91  soft-tissue]
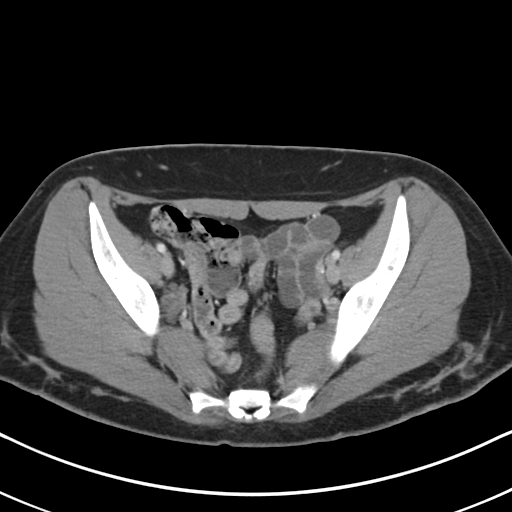
[im 32/91  soft-tissue]
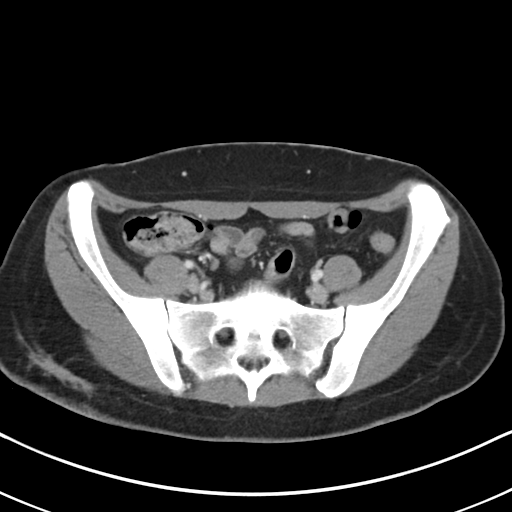
[im 39/91  soft-tissue]
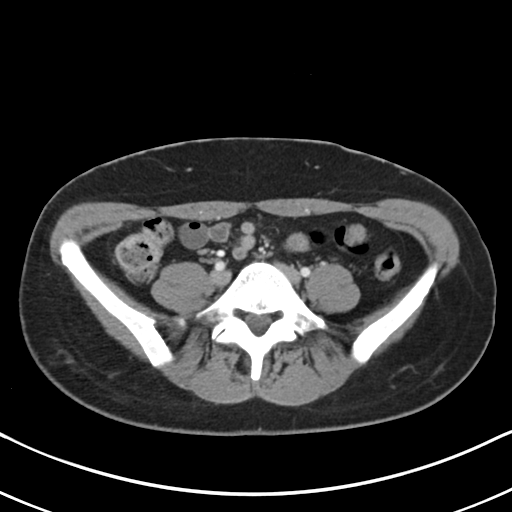
[im 46/91  soft-tissue]
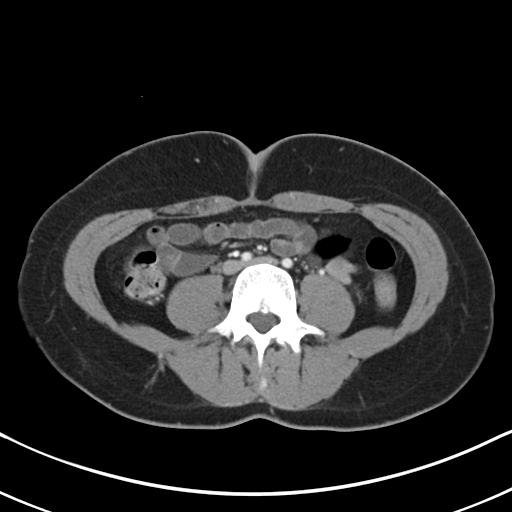
[im 52/91  soft-tissue]
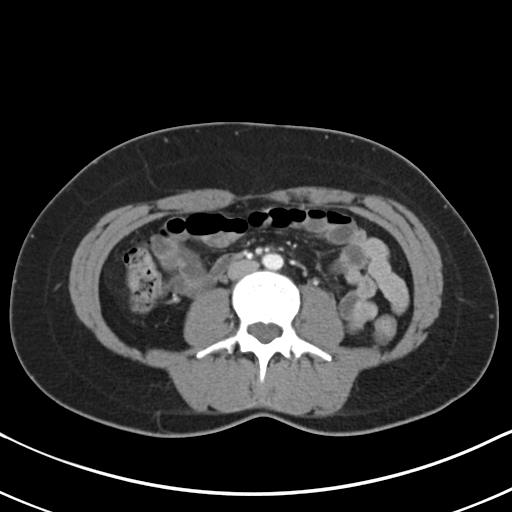
[im 59/91  soft-tissue]
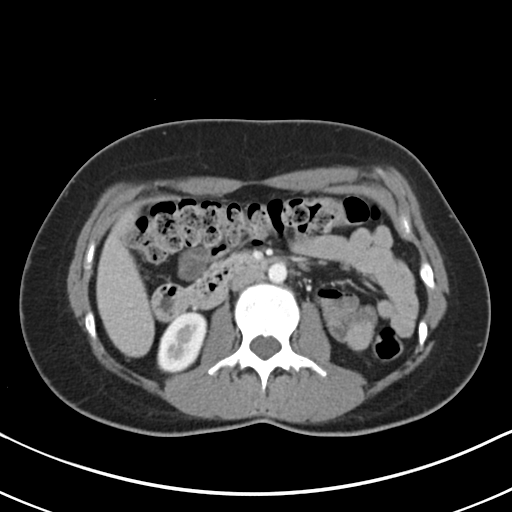
[im 59/91  bone]
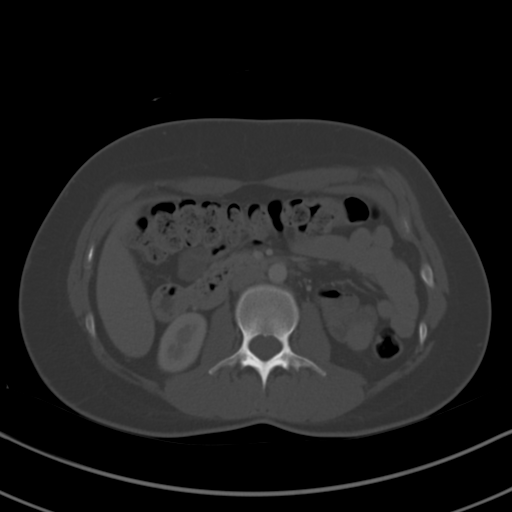
[im 66/91  soft-tissue]
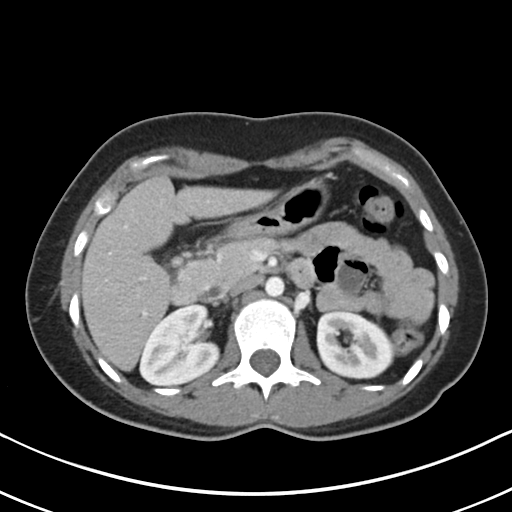
[im 73/91  soft-tissue]
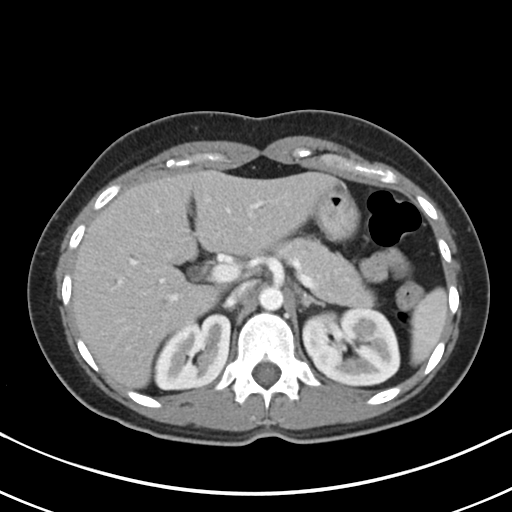
[im 80/91  soft-tissue]
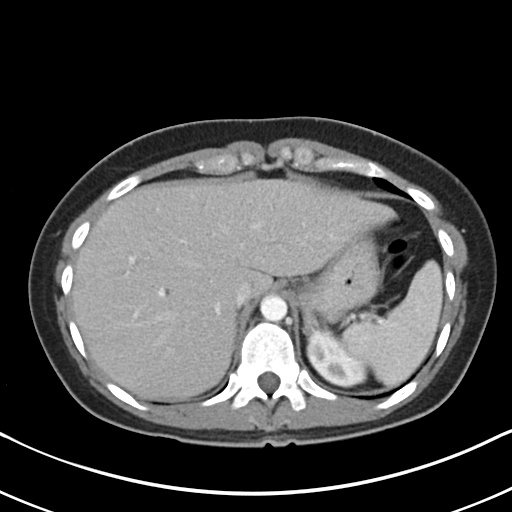
[im 87/91  soft-tissue]
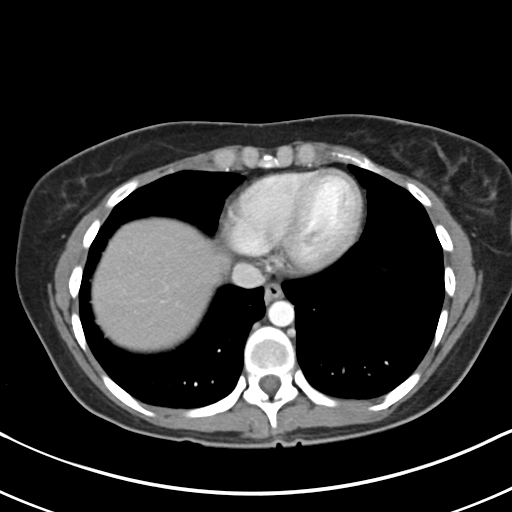

[Series 5: coronal soft tissue · coronal · 0.73mm/px · 3 of 72 slices shown]
[im 24/72  soft-tissue]
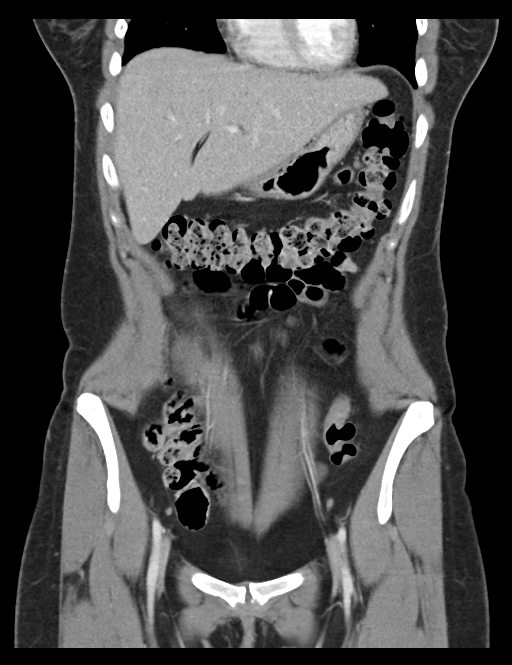
[im 32/72  soft-tissue]
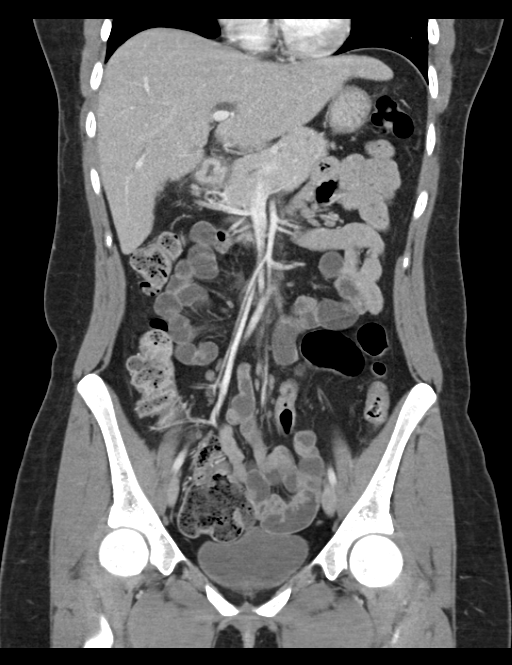
[im 40/72  soft-tissue]
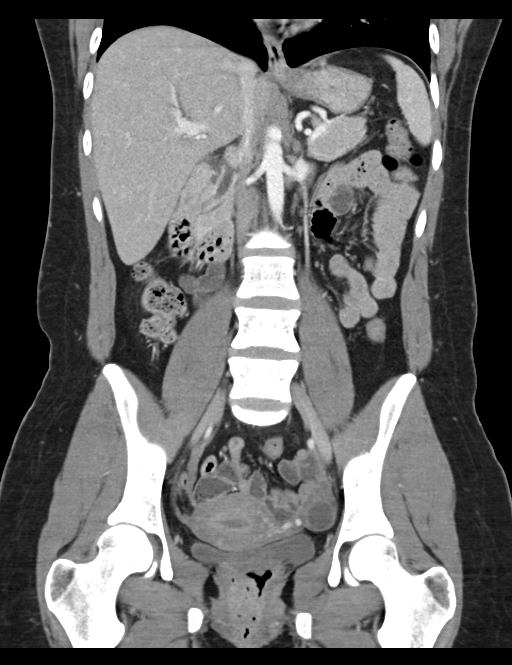

[16 of 46 positions shown; findings below may reference images not displayed]

FINDINGS: Lower chest and abdominal wall: Bilateral gluteal subcutaneous
scarring.

Hepatobiliary: Wispy hypervascularity in the right liver on [DATE] is
likely perfusion anomaly.Cholecystectomy with chronic intra and
extrahepatic bile duct enlargement attributed reservoir effects.

Pancreas: Edema around the uncinate process and head without
parenchymal expansion or interstitial edema.

Spleen: Unremarkable.

Adrenals/Urinary Tract: Negative adrenals. No hydronephrosis or
stone. Unremarkable bladder.

Reproductive:No pathologic findings.

Stomach/Bowel: Circumferential edema around the proximal duodenum
with regional mild adenopathy. As confirmed on reformats, no
extraluminal gas. No notable bowel wall thickening. No obstruction.
No appendicitis.

Vascular/Lymphatic: No acute vascular abnormality.

Peritoneal: No ascites or pneumoperitoneum.

Musculoskeletal: No acute abnormalities.
IMPRESSION: 1. Right upper quadrant inflammation, favor peptic ulcer disease
over groove pancreatitis.
2. Cholecystectomy with chronic biliary dilatation.

## 2021-11-19 NOTE — H&P (Signed)
[name] is a 35 y.o. female here TVH and bilateral salpingectomy   cxbx which show CIN2 . She is s/p LEEP 2006 for cervical dysplasia  S/p SVD x 3   Past Medical History:  has a past medical history of Depression, Gastric ulcer, and Migraine.  Past Surgical History:  has a past surgical history that includes Tubal ligation; Cholecystectomy; dental implant; egd (04/29/2013); egd (02/06/2016); egd (05/07/2016); and Cervical biopsy w/ loop electrode excision. Family History: family history includes Barrett's esophagus in her father; Colon cancer (age of onset: 35) in her mother. Social History:  reports that she has quit smoking. She has never used smokeless tobacco. She reports current alcohol use. She reports that she does not use drugs. OB/GYN History:          OB History     Gravida  3   Para  3   Term      Preterm      AB      Living  3      SAB      IAB      Ectopic      Molar      Multiple      Live Births  3             Allergies: is allergic to metoclopramide and latex. Medications:   Current Outpatient Medications:    brompheniramine-pseudoephed-DM (BROMFED DM) 2-30-10 mg/5 mL syrup, Take 5 mLs by mouth every 6 (six) hours as needed for Cough (Patient not taking: Reported on 11/09/2021), Disp: 118 mL, Rfl: 0   imiquimod (ALDARA) 5 % cream, Apply 1 packet topically 3 (three) times a week (Patient not taking: Reported on 10/29/2021), Disp: , Rfl:    Review of Systems: General:                      No fatigue or weight loss Eyes:                           No vision changes Ears:                            No hearing difficulty Respiratory:                No cough or shortness of breath Pulmonary:                  No asthma or shortness of breath Cardiovascular:           No chest pain, palpitations, dyspnea on exertion Gastrointestinal:          No abdominal bloating, chronic diarrhea, constipations, masses, pain or hematochezia Genitourinary:              No hematuria, dysuria, abnormal vaginal discharge, pelvic pain, Menometrorrhagia Lymphatic:                   No swollen lymph nodes Musculoskeletal:         No muscle weakness Neurologic:                  No extremity weakness, syncope, seizure disorder Psychiatric:                  No history of depression, delusions or suicidal/homicidal ideation      Exam:       Vitals:    11/09/21  1519  BP: 117/80  Pulse: 103      Body mass index is 24.89 kg/m.   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     Plans:  After a thorough discussion regarding tx options : repeat LEEP  , CKC or definitive sx TVH and bilateral salpingectomy . She and mother have elected for [facility] .        Electronically signed by Vilma Prader, MD on 11/09/2021  4:41 PM

## 2021-11-27 ENCOUNTER — Other Ambulatory Visit: Payer: Self-pay

## 2021-11-27 ENCOUNTER — Encounter: Payer: Self-pay | Admitting: Urgent Care

## 2021-11-27 ENCOUNTER — Other Ambulatory Visit
Admission: RE | Admit: 2021-11-27 | Discharge: 2021-11-27 | Disposition: A | Discharge status: Home / Self Care | Payer: BC Managed Care – PPO | Source: Ambulatory Visit | Attending: Obstetrics and Gynecology | Admitting: Obstetrics and Gynecology

## 2021-11-27 DIAGNOSIS — Z01812 Encounter for preprocedural laboratory examination: Secondary | ICD-10-CM | POA: Insufficient documentation

## 2021-11-27 DIAGNOSIS — Z01818 Encounter for other preprocedural examination: Secondary | ICD-10-CM

## 2021-11-27 HISTORY — DX: Family history of other specified conditions: Z84.89

## 2021-11-27 HISTORY — DX: Anemia, unspecified: D64.9

## 2021-11-27 HISTORY — DX: Pneumonia, unspecified organism: J18.9

## 2021-11-27 LAB — BASIC METABOLIC PANEL
Anion gap: 8 (ref 5–15)
BUN: 10 mg/dL (ref 6–20)
CO2: 25 mmol/L (ref 22–32)
Calcium: 9.3 mg/dL (ref 8.9–10.3)
Chloride: 103 mmol/L (ref 98–111)
Creatinine, Ser: 0.63 mg/dL (ref 0.44–1.00)
GFR, Estimated: >60 mL/min (ref >60)
Glucose, Bld: 105 mg/dL — ABNORMAL HIGH (ref 70–99)
Potassium: 3.1 mmol/L — ABNORMAL LOW (ref 3.5–5.1)
Sodium: 136 mmol/L (ref 135–145)

## 2021-11-27 LAB — TYPE AND SCREEN
ABO/RH(D): B POS
Antibody Screen: NEGATIVE

## 2021-11-27 LAB — CBC
HCT: 40 % (ref 36.0–46.0)
Hemoglobin: 13.3 g/dL (ref 12.0–15.0)
MCH: 30.2 pg (ref 26.0–34.0)
MCHC: 33.3 g/dL (ref 30.0–36.0)
MCV: 90.7 fL (ref 80.0–100.0)
Platelets: 429 10*3/uL — ABNORMAL HIGH (ref 150–400)
RBC: 4.41 MIL/uL (ref 3.87–5.11)
RDW: 12.7 % (ref 11.5–15.5)
WBC: 10.9 10*3/uL — ABNORMAL HIGH (ref 4.0–10.5)
nRBC: 0 % (ref 0.0–0.2)

## 2021-11-27 NOTE — Patient Instructions (Addendum)
Your procedure is scheduled on: 12/03/21 - Monday Report to the Registration Desk on the 1st floor of the [facility]. To find out your arrival time, please call [phone] between 1PM - 3PM on: 11/30/21 - Friday Report to [facility] Labs on 11/27/21 at 2:00 pm.   REMEMBER: Instructions that are not followed completely may result in serious medical risk, up to and including death; or upon the discretion of your surgeon and anesthesiologist your surgery may need to be rescheduled.  Do not eat food after midnight the night before surgery.  No gum chewing, lozengers or hard candies.  You may however, drink CLEAR liquids up to 2 hours before you are scheduled to arrive for your surgery. Do not drink anything within 2 hours of your scheduled arrival time.  Clear liquids include: - water  - apple juice without pulp - gatorade (not RED, PURPLE, OR BLUE) - black coffee or tea (Do NOT add milk or creamers to the coffee or tea) Do NOT drink anything that is not on this list.  In addition, your doctor has ordered for you to drink the provided  Ensure Pre-Surgery Clear Carbohydrate Drink  Drinking this carbohydrate drink up to two hours before surgery helps to reduce insulin resistance and improve patient outcomes. Please complete drinking 2 hours prior to scheduled arrival time.  TAKE THESE MEDICATIONS THE MORNING OF SURGERY WITH A SIP OF WATER: NONE  One week prior to surgery: Stop Anti-inflammatories (NSAIDS) such as Advil, Aleve, Ibuprofen, Motrin, Naproxen, Naprosyn and Aspirin based products such as Excedrin, Goodys Powder, BC Powder.  Stop ANY OVER THE COUNTER supplements until after surgery.  You may however, continue to take Tylenol if needed for pain up until the day of surgery.  No Alcohol for 24 hours before or after surgery.  No Smoking including e-cigarettes for 24 hours prior to surgery.  No chewable tobacco products for at least 6 hours prior to surgery.  No  nicotine patches on the day of surgery.  Do not use any "recreational" drugs for at least a week prior to your surgery.  Please be advised that the combination of cocaine and anesthesia may have negative outcomes, up to and including death. If you test positive for cocaine, your surgery will be cancelled.  On the morning of surgery brush your teeth with toothpaste and water, you may rinse your mouth with mouthwash if you wish. Do not swallow any toothpaste or mouthwash.  Use CHG Soap or wipes as directed on instruction sheet.  Do not wear jewelry, make-up, hairpins, clips or nail polish.  Do not wear lotions, powders, or perfumes.   Do not shave body from the neck down 48 hours prior to surgery just in case you cut yourself which could leave a site for infection.  Also, freshly shaved skin may become irritated if using the CHG soap.  Contact lenses, hearing aids and dentures may not be worn into surgery.  Do not bring valuables to the hospital. [facility] is not responsible for any missing/lost belongings or valuables.   Notify your doctor if there is any change in your medical condition (cold, fever, infection).  Wear comfortable clothing (specific to your surgery type) to the hospital.  After surgery, you can help prevent lung complications by doing breathing exercises.  Take deep breaths and cough every 1-2 hours. Your doctor may order a device called an Incentive Spirometer to help you take deep breaths. When coughing or sneezing, hold a pillow  firmly against your incision with both hands. This is called "splinting." Doing this helps protect your incision. It also decreases belly discomfort.  If you are being admitted to the hospital overnight, leave your suitcase in the car. After surgery it may be brought to your room.  If you are being discharged the day of surgery, you will not be allowed to drive home. You will need a responsible adult (18 years or older) to drive you home  and stay with you that night.   If you are taking public transportation, you will need to have a responsible adult (18 years or older) with you. Please confirm with your physician that it is acceptable to use public transportation.   Please call the Pre-admissions Testing Dept. at [phone] if you have any questions about these instructions.  Surgery Visitation Policy:  Patients undergoing a surgery or procedure may have one family member or support person with them as long as that person is not COVID-19 positive or experiencing its symptoms.  That person may remain in the waiting area during the procedure and may rotate out with other people.  Inpatient Visitation:    Visiting hours are 7 a.m. to 8 p.m. Up to two visitors ages 16+ are allowed at one time in a patient room. The visitors may rotate out with other people during the day. Visitors must check out when they leave, or other visitors will not be allowed. One designated support person may remain overnight. The visitor must pass COVID-19 screenings, use hand sanitizer when entering and exiting the patient's room and wear a mask at all times, including in the patient's room. Patients must also wear a mask when staff or their visitor are in the room. Masking is required regardless of vaccination status.

## 2021-11-27 NOTE — Progress Notes (Signed)
  [facility] Perioperative Services: Pre-Admission/Anesthesia Testing  Abnormal Lab Notification   Date: 11/27/21  Name: [name] MRN:   [id number]  Re: Abnormal labs noted during PAT appointment   Notified:  Provider Name Provider Role Notification Mode  Schermerhorn, Maisie Fus, MD OB/GYN Routed and/or faxed via [facility]   Clinical Information and Notes:  ABNORMAL LAB VALUE(S): Lab Results  Component Value Date   K 3.1 (L) 11/27/2021   [name] is scheduled for an elective VAGINAL HYSTERECTOMY on 12/03/2021. In review of her medication reconciliation, it is noted that the patient is NOT taking any type of prescribed diuretic medications. Please note, in efforts to promote a safe and effective anesthetic course, per current guidelines/standards set by the [facility] anesthesia team, the minimal acceptable K+ level for the patient to proceed with general anesthesia is 3.0 mmol/L. With that being said, if the patient drops any lower, her elective procedure will need to be postponed until K+ is better optimized. Abnormal result is being forwarded to primary attending surgeon for review and consideration of optimization. Order placed to have K+ rechecked on the day of her procedure to ensure correction of the noted derangement.    This is a Personal assistant; no formal response is required.  Quentin Mulling, MSN, APRN, FNP-C, CEN [facility]  Peri-operative Services Nurse Practitioner Phone: [phone] Fax: [phone] 11/27/21 5:14 PM

## 2021-12-03 ENCOUNTER — Other Ambulatory Visit: Payer: Self-pay

## 2021-12-03 ENCOUNTER — Encounter
Admission: RE | Disposition: A | Discharge status: Home / Self Care | Payer: Self-pay | Source: Home / Self Care | Attending: Obstetrics and Gynecology

## 2021-12-03 ENCOUNTER — Ambulatory Visit: Payer: Self-pay

## 2021-12-03 ENCOUNTER — Encounter: Payer: Self-pay | Admitting: Obstetrics and Gynecology

## 2021-12-03 ENCOUNTER — Ambulatory Visit
Admission: RE | Admit: 2021-12-03 | Discharge: 2021-12-03 | Disposition: A | Discharge status: Home / Self Care | Payer: Self-pay | Attending: Obstetrics and Gynecology | Admitting: Obstetrics and Gynecology

## 2021-12-03 DIAGNOSIS — Z87891 Personal history of nicotine dependence: Secondary | ICD-10-CM | POA: Insufficient documentation

## 2021-12-03 DIAGNOSIS — D069 Carcinoma in situ of cervix, unspecified: Secondary | ICD-10-CM | POA: Insufficient documentation

## 2021-12-03 DIAGNOSIS — Z01818 Encounter for other preprocedural examination: Secondary | ICD-10-CM

## 2021-12-03 DIAGNOSIS — Z87410 Personal history of cervical dysplasia: Secondary | ICD-10-CM | POA: Insufficient documentation

## 2021-12-03 HISTORY — PX: VAGINAL HYSTERECTOMY: SHX2639

## 2021-12-03 LAB — POCT I-STAT, CHEM 8
BUN: 7 mg/dL (ref 6–20)
Calcium, Ion: 1.15 mmol/L (ref 1.15–1.40)
Chloride: 102 mmol/L (ref 98–111)
Creatinine, Ser: 0.6 mg/dL (ref 0.44–1.00)
Glucose, Bld: 92 mg/dL (ref 70–99)
HCT: 41 % (ref 36.0–46.0)
Hemoglobin: 13.9 g/dL (ref 12.0–15.0)
Potassium: 3.9 mmol/L (ref 3.5–5.1)
Sodium: 138 mmol/L (ref 135–145)
TCO2: 25 mmol/L (ref 22–32)

## 2021-12-03 LAB — POCT PREGNANCY, URINE: Pregnancy Test, Urine: NEGATIVE

## 2021-12-03 SURGERY — HYSTERECTOMY, VAGINAL
Anesthesia: General

## 2021-12-03 MED ORDER — ROCURONIUM BROMIDE 10 MG/ML (PF) SYRINGE
PREFILLED_SYRINGE | INTRAVENOUS | Status: AC
  Filled 2021-12-03: qty 10

## 2021-12-03 MED ORDER — FAMOTIDINE 20 MG PO TABS: 20.0000 mg | ORAL_TABLET | Freq: Once | ORAL | Status: AC

## 2021-12-03 MED ORDER — LIDOCAINE-EPINEPHRINE 1 %-1:100000 IJ SOLN
INTRAMUSCULAR | Status: AC
  Filled 2021-12-03: qty 1

## 2021-12-03 MED ORDER — EPHEDRINE SULFATE 50 MG/ML IJ SOLN
INTRAMUSCULAR | Status: DC | PRN
  Administered 2021-12-03: 5 mg via INTRAVENOUS

## 2021-12-03 MED ORDER — GABAPENTIN 300 MG PO CAPS
ORAL_CAPSULE | ORAL | Status: AC
  Administered 2021-12-03: 300 mg via ORAL
  Filled 2021-12-03: qty 1

## 2021-12-03 MED ORDER — LACTATED RINGERS IV SOLN: INTRAVENOUS | Status: DC

## 2021-12-03 MED ORDER — KETOROLAC TROMETHAMINE 30 MG/ML IJ SOLN
INTRAMUSCULAR | Status: DC | PRN
  Administered 2021-12-03: 30 mg via INTRAVENOUS

## 2021-12-03 MED ORDER — POVIDONE-IODINE 10 % EX SWAB: 2.0000 "application " | Freq: Once | CUTANEOUS | Status: DC

## 2021-12-03 MED ORDER — PHENYLEPHRINE HCL (PRESSORS) 10 MG/ML IV SOLN
INTRAVENOUS | Status: DC | PRN
  Administered 2021-12-03 (×3): 80 ug via INTRAVENOUS
  Administered 2021-12-03: 160 ug via INTRAVENOUS
  Administered 2021-12-03 (×2): 80 ug via INTRAVENOUS

## 2021-12-03 MED ORDER — PROPOFOL 10 MG/ML IV BOLUS
INTRAVENOUS | Status: DC | PRN
  Administered 2021-12-03: 150 mg via INTRAVENOUS

## 2021-12-03 MED ORDER — ACETAMINOPHEN 500 MG PO TABS: 1000.0000 mg | ORAL_TABLET | ORAL | Status: AC

## 2021-12-03 MED ORDER — PROPOFOL 10 MG/ML IV BOLUS
INTRAVENOUS | Status: AC
  Filled 2021-12-03: qty 20

## 2021-12-03 MED ORDER — LIDOCAINE HCL (CARDIAC) PF 100 MG/5ML IV SOSY
PREFILLED_SYRINGE | INTRAVENOUS | Status: DC | PRN
  Administered 2021-12-03: 80 mg via INTRAVENOUS

## 2021-12-03 MED ORDER — OXYCODONE-ACETAMINOPHEN 5-325 MG PO TABS: 1.0000 | ORAL_TABLET | ORAL | Status: DC | PRN

## 2021-12-03 MED ORDER — HYDROMORPHONE HCL 1 MG/ML IJ SOLN
INTRAMUSCULAR | Status: AC
  Administered 2021-12-03: 0.5 mg via INTRAVENOUS
  Filled 2021-12-03: qty 1

## 2021-12-03 MED ORDER — SUGAMMADEX SODIUM 200 MG/2ML IV SOLN
INTRAVENOUS | Status: DC | PRN
  Administered 2021-12-03: 200 mg via INTRAVENOUS

## 2021-12-03 MED ORDER — FENTANYL CITRATE (PF) 100 MCG/2ML IJ SOLN
INTRAMUSCULAR | Status: AC
  Filled 2021-12-03: qty 2

## 2021-12-03 MED ORDER — CEFAZOLIN SODIUM-DEXTROSE 2-4 GM/100ML-% IV SOLN
INTRAVENOUS | Status: AC
  Filled 2021-12-03: qty 100

## 2021-12-03 MED ORDER — DEXAMETHASONE SODIUM PHOSPHATE 10 MG/ML IJ SOLN
INTRAMUSCULAR | Status: AC
  Filled 2021-12-03: qty 1

## 2021-12-03 MED ORDER — ONDANSETRON HCL 4 MG/2ML IJ SOLN
INTRAMUSCULAR | Status: DC | PRN
  Administered 2021-12-03: 4 mg via INTRAVENOUS

## 2021-12-03 MED ORDER — ONDANSETRON 4 MG PO TBDP: 4.0000 mg | ORAL_TABLET | Freq: Four times a day (QID) | ORAL | Status: DC | PRN

## 2021-12-03 MED ORDER — FENTANYL CITRATE (PF) 100 MCG/2ML IJ SOLN
25.0000 ug | INTRAMUSCULAR | Status: DC | PRN
  Administered 2021-12-03: 25 ug via INTRAVENOUS
  Administered 2021-12-03: 50 ug via INTRAVENOUS

## 2021-12-03 MED ORDER — ROCURONIUM BROMIDE 100 MG/10ML IV SOLN
INTRAVENOUS | Status: DC | PRN
  Administered 2021-12-03: 20 mg via INTRAVENOUS
  Administered 2021-12-03: 50 mg via INTRAVENOUS

## 2021-12-03 MED ORDER — LIDOCAINE-EPINEPHRINE 1 %-1:100000 IJ SOLN
INTRAMUSCULAR | Status: DC | PRN
  Administered 2021-12-03: 10 mL

## 2021-12-03 MED ORDER — CEFAZOLIN SODIUM-DEXTROSE 2-4 GM/100ML-% IV SOLN
2.0000 g | Freq: Once | INTRAVENOUS | Status: AC
  Administered 2021-12-03: 2 g via INTRAVENOUS

## 2021-12-03 MED ORDER — CHLORHEXIDINE GLUCONATE 0.12 % MT SOLN: 15.0000 mL | Freq: Once | OROMUCOSAL | Status: AC

## 2021-12-03 MED ORDER — GABAPENTIN 300 MG PO CAPS: 300.0000 mg | ORAL_CAPSULE | ORAL | Status: AC

## 2021-12-03 MED ORDER — DEXAMETHASONE SODIUM PHOSPHATE 10 MG/ML IJ SOLN
INTRAMUSCULAR | Status: DC | PRN
  Administered 2021-12-03: 10 mg via INTRAVENOUS

## 2021-12-03 MED ORDER — KETOROLAC TROMETHAMINE 30 MG/ML IJ SOLN
INTRAMUSCULAR | Status: AC
  Filled 2021-12-03: qty 1

## 2021-12-03 MED ORDER — ORAL CARE MOUTH RINSE: 15.0000 mL | Freq: Once | OROMUCOSAL | Status: AC

## 2021-12-03 MED ORDER — LIDOCAINE HCL (PF) 2 % IJ SOLN
INTRAMUSCULAR | Status: AC
  Filled 2021-12-03: qty 5

## 2021-12-03 MED ORDER — OXYCODONE HCL 5 MG PO TABS
5.0000 mg | ORAL_TABLET | Freq: Once | ORAL | Status: AC | PRN
  Administered 2021-12-03: 5 mg via ORAL

## 2021-12-03 MED ORDER — MIDAZOLAM HCL 2 MG/2ML IJ SOLN
INTRAMUSCULAR | Status: DC | PRN
  Administered 2021-12-03 (×2): 1 mg via INTRAVENOUS

## 2021-12-03 MED ORDER — FENTANYL CITRATE (PF) 100 MCG/2ML IJ SOLN
INTRAMUSCULAR | Status: AC
  Administered 2021-12-03: 25 ug via INTRAVENOUS
  Filled 2021-12-03: qty 2

## 2021-12-03 MED ORDER — OXYCODONE HCL 5 MG PO TABS
ORAL_TABLET | ORAL | Status: AC
  Filled 2021-12-03: qty 1

## 2021-12-03 MED ORDER — HYDROMORPHONE HCL 1 MG/ML IJ SOLN
0.5000 mg | INTRAMUSCULAR | Status: AC | PRN
  Administered 2021-12-03 (×2): 0.5 mg via INTRAVENOUS

## 2021-12-03 MED ORDER — CHLORHEXIDINE GLUCONATE 0.12 % MT SOLN
OROMUCOSAL | Status: AC
  Administered 2021-12-03: 15 mL via OROMUCOSAL
  Filled 2021-12-03: qty 15

## 2021-12-03 MED ORDER — OXYCODONE HCL 5 MG/5ML PO SOLN: 5.0000 mg | Freq: Once | ORAL | Status: AC | PRN

## 2021-12-03 MED ORDER — 0.9 % SODIUM CHLORIDE (POUR BTL) OPTIME
TOPICAL | Status: DC | PRN
  Administered 2021-12-03: 100 mL

## 2021-12-03 MED ORDER — ACETAMINOPHEN 10 MG/ML IV SOLN: 1000.0000 mg | Freq: Once | INTRAVENOUS | Status: DC | PRN

## 2021-12-03 MED ORDER — FENTANYL CITRATE (PF) 100 MCG/2ML IJ SOLN
INTRAMUSCULAR | Status: AC
  Administered 2021-12-03: 50 ug via INTRAVENOUS
  Filled 2021-12-03: qty 2

## 2021-12-03 MED ORDER — FENTANYL CITRATE (PF) 100 MCG/2ML IJ SOLN
INTRAMUSCULAR | Status: DC | PRN
  Administered 2021-12-03 (×2): 50 ug via INTRAVENOUS

## 2021-12-03 MED ORDER — ONDANSETRON HCL 4 MG/2ML IJ SOLN
INTRAMUSCULAR | Status: AC
  Filled 2021-12-03: qty 2

## 2021-12-03 MED ORDER — ONDANSETRON HCL 4 MG/2ML IJ SOLN: 4.0000 mg | Freq: Once | INTRAMUSCULAR | Status: DC | PRN

## 2021-12-03 MED ORDER — PHENYLEPHRINE HCL-NACL 20-0.9 MG/250ML-% IV SOLN
INTRAVENOUS | Status: AC
  Filled 2021-12-03: qty 250

## 2021-12-03 MED ORDER — ACETAMINOPHEN 500 MG PO TABS
ORAL_TABLET | ORAL | Status: AC
  Administered 2021-12-03: 1000 mg via ORAL
  Filled 2021-12-03: qty 2

## 2021-12-03 MED ORDER — FAMOTIDINE 20 MG PO TABS
ORAL_TABLET | ORAL | Status: AC
  Administered 2021-12-03: 20 mg via ORAL
  Filled 2021-12-03: qty 1

## 2021-12-03 MED ORDER — MIDAZOLAM HCL 2 MG/2ML IJ SOLN
INTRAMUSCULAR | Status: AC
  Filled 2021-12-03: qty 2

## 2021-12-03 SURGICAL SUPPLY — 42 items
BAG DRN RND TRDRP ANRFLXCHMBR (UROLOGICAL SUPPLIES)
BAG URINE DRAIN 2000ML AR STRL (UROLOGICAL SUPPLIES) ×1 IMPLANT
CATH FOLEY 2WAY  5CC 16FR (CATHETERS) ×1
CATH FOLEY 2WAY 5CC 16FR (CATHETERS) ×2
CATH ROBINSON RED A/P 16FR (CATHETERS) ×3 IMPLANT
CATH URTH 16FR FL 2W BLN LF (CATHETERS) ×2 IMPLANT
DRAPE PERI LITHO V/GYN (MISCELLANEOUS) ×3 IMPLANT
DRAPE SURG 17X11 SM STRL (DRAPES) ×3 IMPLANT
DRAPE UNDER BUTTOCK W/FLU (DRAPES) ×3 IMPLANT
ELECT REM PT RETURN 9FT ADLT (ELECTROSURGICAL) ×3
ELECTRODE REM PT RTRN 9FT ADLT (ELECTROSURGICAL) ×2 IMPLANT
GAUZE 4X4 16PLY ~~LOC~~+RFID DBL (SPONGE) ×6 IMPLANT
GLOVE SURG SYN 8.0 (GLOVE) ×3 IMPLANT
GLOVE SURG SYN 8.0 PF PI (GLOVE) ×1 IMPLANT
GOWN STRL REUS W/ TWL LRG LVL3 (GOWN DISPOSABLE) ×6 IMPLANT
GOWN STRL REUS W/ TWL XL LVL3 (GOWN DISPOSABLE) ×2 IMPLANT
GOWN STRL REUS W/TWL LRG LVL3 (GOWN DISPOSABLE) ×9
GOWN STRL REUS W/TWL XL LVL3 (GOWN DISPOSABLE) ×3
KIT PINK PAD W/HEAD ARE REST (MISCELLANEOUS) ×3
KIT PINK PAD W/HEAD ARM REST (MISCELLANEOUS) ×2 IMPLANT
KIT TURNOVER CYSTO (KITS) ×3 IMPLANT
LABEL OR SOLS (LABEL) ×3 IMPLANT
MANIFOLD NEPTUNE II (INSTRUMENTS) ×3 IMPLANT
NEEDLE HYPO 22GX1.5 SAFETY (NEEDLE) ×3 IMPLANT
PACK BASIN MINOR ARMC (MISCELLANEOUS) ×3 IMPLANT
PAD OB MATERNITY 4.3X12.25 (PERSONAL CARE ITEMS) ×3 IMPLANT
PAD PREP 24X41 OB/GYN DISP (PERSONAL CARE ITEMS) ×3 IMPLANT
SCRUB EXIDINE 4% CHG 4OZ (MISCELLANEOUS) ×3 IMPLANT
SOL PREP PROV IODINE SCRUB 4OZ (MISCELLANEOUS) ×3 IMPLANT
SOL PREP PVP 2OZ (MISCELLANEOUS)
SOLUTION PREP PVP 2OZ (MISCELLANEOUS) ×1 IMPLANT
SURGILUBE 2OZ TUBE FLIPTOP (MISCELLANEOUS) ×3 IMPLANT
SUT PDS 2-0 27IN (SUTURE) IMPLANT
SUT VIC AB 0 CT1 27 (SUTURE) ×9
SUT VIC AB 0 CT1 27XCR 8 STRN (SUTURE) ×5 IMPLANT
SUT VIC AB 0 CT1 36 (SUTURE) ×3 IMPLANT
SUT VIC AB 2-0 SH 27 (SUTURE) ×3
SUT VIC AB 2-0 SH 27XBRD (SUTURE) ×2 IMPLANT
SYR 10ML LL (SYRINGE) ×3 IMPLANT
SYR CONTROL 10ML LL (SYRINGE) ×3 IMPLANT
WATER STERILE IRR 1000ML POUR (IV SOLUTION) ×1 IMPLANT
WATER STERILE IRR 500ML POUR (IV SOLUTION) ×1 IMPLANT

## 2021-12-03 NOTE — Anesthesia Postprocedure Evaluation (Signed)
Anesthesia Post Note  Patient: [name]  Procedure(s) Performed: HYSTERECTOMY VAGINAL  Patient location during evaluation: PACU Anesthesia Type: General Level of consciousness: awake and alert, oriented and patient cooperative Pain management: pain level controlled Vital Signs Assessment: post-procedure vital signs reviewed and stable Respiratory status: spontaneous breathing, nonlabored ventilation and respiratory function stable Cardiovascular status: blood pressure returned to baseline and stable Postop Assessment: adequate PO intake Anesthetic complications: no   No notable events documented.   Last Vitals:  Vitals:   12/03/21 1015 12/03/21 1045  BP: 114/85 133/80  Pulse: 86 90  Resp: 10 14  Temp: (!) 36.4 C 36.6 C  SpO2: 97% 100%    Last Pain:  Vitals:   12/03/21 1045  TempSrc: Temporal  PainSc: 3                  Reed Breech

## 2021-12-03 NOTE — Anesthesia Procedure Notes (Signed)
Procedure Name: Intubation Date/Time: 12/03/2021 7:37 AM Performed by: Gari Crown, RN Pre-anesthesia Checklist: Patient identified, Emergency Drugs available, Suction available and Patient being monitored Patient Re-evaluated:Patient Re-evaluated prior to induction Oxygen Delivery Method: Circle system utilized Preoxygenation: Pre-oxygenation with 100% oxygen Induction Type: IV induction Ventilation: Mask ventilation without difficulty Laryngoscope Size: McGraph and 3 Grade View: Grade II Tube type: Oral Tube size: 6.5 mm Number of attempts: 1 Airway Equipment and Method: Stylet and Oral airway Placement Confirmation: ETT inserted through vocal cords under direct vision, positive ETCO2 and breath sounds checked- equal and bilateral Secured at: 20 cm Tube secured with: Tape Dental Injury: Teeth and Oropharynx as per pre-operative assessment

## 2021-12-03 NOTE — Brief Op Note (Signed)
12/03/2021  8:49 AM  PATIENT:  [name]  35 y.o. female  PRE-OPERATIVE DIAGNOSIS:  recurrent cervical dysplasia  POST-OPERATIVE DIAGNOSIS: recurrent  cervical dysplasia  PROCEDURE:  Procedure(s): HYSTERECTOMY VAGINAL (N/A)  SURGEON:  Surgeon(s) and Role:    * Zanobia Griebel, Ihor Austin, MD - Primary    * Christeen Douglas, MD - Assisting  PHYSICIAN ASSISTANT:   ASSISTANTS: none   ANESTHESIA:   general  EBL:  10 mL IOF 500 cc UO; 100 cc  BLOOD ADMINISTERED:none  DRAINS: none   LOCAL MEDICATIONS USED:  LIDOCAINE   SPECIMEN:  Source of Specimen:  cervix and uterus   DISPOSITION OF SPECIMEN:  PATHOLOGY  COUNTS:  YES  TOURNIQUET:  * No tourniquets in log *  DICTATION: .Other Dictation: Dictation Number verbal   PLAN OF CARE: Discharge to home after PACU  PATIENT DISPOSITION:  PACU - hemodynamically stable.   Delay start of Pharmacological VTE agent (>24hrs) due to surgical blood loss or risk of bleeding: not applicable

## 2021-12-03 NOTE — Progress Notes (Signed)
Pt ready for TVH , BS . NPO . Labs reviewed . K+ in creased  to normal . Neg HCG . Proceed

## 2021-12-03 NOTE — Op Note (Signed)
[name], [facility] MEDICAL RECORD NO: [id number] ACCOUNT NO: [id number] DATE OF BIRTH: 11/03/1986 FACILITY: [facility] LOCATION: [facility] PHYSICIAN: Suzy Bouchard, MD  Operative Report   PREOPERATIVE DIAGNOSIS:  Recurrent cervical dysplasia.  POSTOPERATIVE DIAGNOSIS:  Recurrent cervical dysplasia.  PROCEDURE:  Total vaginal hysterectomy.  SURGEON:  Suzy Bouchard, MD  ASSISTANT:  Christeen Douglas, MD  ANESTHESIA:  General endotracheal anesthesia.  INDICATIONS:  A 35 year old gravida 3, para 3.  The patient is status post a LEEP procedure in 2006 with recent recurrent cervical dysplasia, grade 2.  The patient has elected for definitive surgical intervention.  DESCRIPTION OF PROCEDURE:  After adequate general endotracheal anesthesia, the patient was placed in dorsal supine position with legs in the candy cane stirrups.  Lower abdomen, perineum and vagina were prepped and draped in normal sterile fashion.  The  patient did receive 2 grams of IV Ancef prior to surgery for surgical prophylaxis.  Bladder was straight catheterized yielding 100 mL clear urine.  A weighted speculum was placed in the posterior vaginal vault and the cervix was grasped with 2 thyroid  tenaculum.  Cervix was then circumferentially injected with 1% lidocaine with 1:100,000 epinephrine.  The posterior cul-de-sac was grasped with Allis clamp and a direct posterior colpotomy incision was made. Upon entry into the posterior cul-de-sac, a  long weighted billed speculum was placed.  Uterosacral ligaments were then bilaterally clamped, transected and suture ligated with 0 Vicryl suture and tagged for later identification.  Anterior cervix was circumferentially incised with the Bovie and the  cardinal ligaments were then bilaterally clamped, transected and suture ligated with 0 Vicryl suture.  Anterior cul-de-sac was entered sharply without difficulty.  A Deaver retractor was placed within to elevate the bladder  anteriorly.  The uterine  arteries were then bilaterally clamped, transected and suture ligated with 0 Vicryl suture.  Additional clamping and cutting continued until the cornua were then bilaterally clamped, transected, doubly ligated and the uterus and cervix were delivered.   Pedicles were doubly ligated with 0 Vicryl suture.  The patient was originally scheduled for bilateral salpingectomy; however, the fallopian tubes and the fimbriated ends of the fallopian tubes could not be identified.  Ovaries appeared normal.  There  was no bleeding active at this point.  The peritoneum was then closed with a pursestring 2-0 PDS suture with closure of the peritoneum and the vaginal cuff was then closed vertically with an 0 Vicryl suture running nonlocking.  Uterosacral ligaments were  ligated and tied centrally and the rest of the vaginal vault closed with 0 Vicryl suture.  Good hemostasis noted.  Straight catheterization of the bladder yielded additional 10 mL of urine.  ESTIMATED BLOOD LOSS:  10 mL.  INTRAOPERATIVE FLUIDS:  500 mL.  URINE OUTPUT:  110 mL.  The patient tolerated the procedure well and was taken to recovery room in good condition.   NIK D: 12/03/2021 11:26:58 am T: 12/03/2021 10:11:00 pm  JOB: [id number]/ [id number]

## 2021-12-03 NOTE — Discharge Instructions (Addendum)
AMBULATORY SURGERY  DISCHARGE INSTRUCTIONS   The drugs that you were given will stay in your system until tomorrow so for the next 24 hours you should not:  Drive an automobile Make any legal decisions Drink any alcoholic beverage   You may resume regular meals tomorrow.  Today it is better to start with liquids and gradually work up to solid foods.  You may eat anything you prefer, but it is better to start with liquids, then soup and crackers, and gradually work up to solid foods.   Please notify your doctor immediately if you have any unusual bleeding, trouble breathing, redness and pain at the surgery site, drainage, fever, or pain not relieved by medication.     Your post-operative visit with Dr.                                       is: Date:                        Time:    Please call to schedule your post-operative visit.  Additional Instructions: Restart Ibuprofen at 3 pm due to receiving it in this hospital during surgery

## 2021-12-03 NOTE — Anesthesia Preprocedure Evaluation (Signed)
Anesthesia Evaluation  Patient identified by MRN, date of birth, ID band Patient awake    Reviewed: Allergy & Precautions, NPO status , Patient's Chart, lab work & pertinent test results  History of Anesthesia Complications Negative for: history of anesthetic complications  Airway Mallampati: I   Neck ROM: Full    Dental   Upper implant:   Pulmonary former smoker (quit 2017),    Pulmonary exam normal breath sounds clear to auscultation       Cardiovascular Exercise Tolerance: Good negative cardio ROS Normal cardiovascular exam Rhythm:Regular Rate:Normal     Neuro/Psych  Headaches,    GI/Hepatic negative GI ROS,   Endo/Other  negative endocrine ROS  Renal/GU negative Renal ROS     Musculoskeletal   Abdominal   Peds  Hematology  (+) Blood dyscrasia, anemia ,   Anesthesia Other Findings   Reproductive/Obstetrics                             Anesthesia Physical Anesthesia Plan  ASA: 2  Anesthesia Plan: General   Post-op Pain Management:    Induction: Intravenous  PONV Risk Score and Plan: 3 and Ondansetron, Dexamethasone and Treatment may vary due to age or medical condition  Airway Management Planned: Oral ETT  Additional Equipment:   Intra-op Plan:   Post-operative Plan: Extubation in OR  Informed Consent: I have reviewed the patients History and Physical, chart, labs and discussed the procedure including the risks, benefits and alternatives for the proposed anesthesia with the patient or authorized representative who has indicated his/her understanding and acceptance.     Dental advisory given  Plan Discussed with: CRNA  Anesthesia Plan Comments: (Patient consented for risks of anesthesia including but not limited to:  - adverse reactions to medications - damage to eyes, teeth, lips or other oral mucosa - nerve damage due to positioning  - sore throat or  hoarseness - damage to heart, brain, nerves, lungs, other parts of body or loss of life  Informed patient about role of CRNA in peri- and intra-operative care.  Patient voiced understanding.)        Anesthesia Quick Evaluation

## 2021-12-03 NOTE — Transfer of Care (Signed)
Immediate Anesthesia Transfer of Care Note  Patient: [name]  Procedure(s) Performed: HYSTERECTOMY VAGINAL  Patient Location: PACU  Anesthesia Type:General  Level of Consciousness: sedated  Airway & Oxygen Therapy: Patient Spontanous Breathing and Patient connected to face mask oxygen  Post-op Assessment: Report given to RN and Post -op Vital signs reviewed and stable  Post vital signs: Reviewed and stable  Last Vitals:  Vitals Value Taken Time  BP 105/70 12/03/21 0900  Temp    Pulse 89 12/03/21 0903  Resp 18 12/03/21 0903  SpO2 100 % 12/03/21 0903  Vitals shown include unvalidated device data.  Last Pain:  Vitals:   12/03/21 0628  TempSrc: Oral  PainSc: 0-No pain         Complications: No notable events documented.

## 2021-12-04 LAB — SURGICAL PATHOLOGY

## 2022-05-28 ENCOUNTER — Ambulatory Visit: Payer: Self-pay | Admitting: *Deleted

## 2022-05-28 NOTE — Telephone Encounter (Signed)
Message from Sharene Skeans sent at 05/28/2022  9:42 AM EDT  Summary: tonsil stones   Pt is having issue with Tonsil stones and husband called to get her in with [facility] / she is not a pt / please advise / pt would like to be seen asap

## 2022-05-28 NOTE — Telephone Encounter (Signed)
Attempted to return call.   Pt wanting to establish with [facility].   Unable to leave message because the voice mailbox is full.

## 2022-05-28 NOTE — Telephone Encounter (Signed)
  Chief Complaint: patient husband , Laurell Roof called to report symptoms. Husband not on DPR or emergency contact. Reported c/o sore throat and " tonsil stones". Obtained information from caller but did not review information regarding patient. Patient not with caller.  Symptoms: sore throat , pus on tonsils back of left side of throat. Pain difficulty swallowing. Can tolerate secretions and fluids and food.  Frequency: started over weekend  Pertinent Negatives: Patient denies fever, difficulty breathing , no drooling, no other sx  Disposition: [] ED /[x] Urgent Care (no appt availability in office) / [] Appointment(In office/virtual)/ []  [facility]/ [] Home Care/ [] Refused Recommended Disposition /[] [facility]/ []  Follow-up with PCP Additional Notes:  Recommended UC due to requesting to be seen as new patient  with [facility]. Patient's husband verbalized understanding to go to [facility] for strep testing.    Reason for Disposition  [1] Pus on tonsils (back of throat) AND [2]  fever AND [3] swollen neck lymph nodes ("glands")  Answer Assessment - Initial Assessment Questions 1. ONSET: "When did the throat start hurting?" (Hours or days ago)      Over the weekend  2. SEVERITY: "How bad is the sore throat?" (Scale 1-10; mild, moderate or severe)   - MILD (1-3):  doesn't interfere with eating or normal activities   - MODERATE (4-7): interferes with eating some solids and normal activities   - SEVERE (8-10):  excruciating pain, interferes with most normal activities   - SEVERE DYSPHAGIA: can't swallow liquids, drooling     Pain with swallowing  3. STREP EXPOSURE: "Has there been any exposure to strep within the past week?" If Yes, ask: "What type of contact occurred?"      No  4.  VIRAL SYMPTOMS: "Are there any symptoms of a cold, such as a runny nose, cough, hoarse voice or red eyes?"      Hoarse  5. FEVER: "Do you have a fever?" If Yes, ask: "What is your temperature, how was it  measured, and when did it start?"     No  6. PUS ON THE TONSILS: "Is there pus on the tonsils in the back of your throat?"     Yes looks like raised "stone" left side of throat  7. OTHER SYMPTOMS: "Do you have any other symptoms?" (e.g., difficulty breathing, headache, rash)     No  8. PREGNANCY: "Is there any chance you are pregnant?" "When was your last menstrual period?"     na  Protocols used: Sore Throat-A-AH
